# Patient Record
Sex: Female | Born: 1990
Health system: Southern US, Community
[De-identification: ages and names within clinical notes are randomized; demographics above are authoritative.]

## PROBLEM LIST (undated history)

## (undated) DIAGNOSIS — F419 Anxiety disorder, unspecified: Secondary | ICD-10-CM

## (undated) DIAGNOSIS — N390 Urinary tract infection, site not specified: Secondary | ICD-10-CM

## (undated) DIAGNOSIS — N750 Cyst of Bartholin's gland: Secondary | ICD-10-CM

## (undated) DIAGNOSIS — T8859XA Other complications of anesthesia, initial encounter: Secondary | ICD-10-CM

## (undated) DIAGNOSIS — N809 Endometriosis, unspecified: Secondary | ICD-10-CM

## (undated) DIAGNOSIS — R5600 Simple febrile convulsions: Secondary | ICD-10-CM

## (undated) DIAGNOSIS — T4145XA Adverse effect of unspecified anesthetic, initial encounter: Secondary | ICD-10-CM

## (undated) HISTORY — DX: Cyst of Bartholin's gland: N75.0

## (undated) HISTORY — PX: SHOULDER SURGERY: SHX246

## (undated) HISTORY — DX: Anxiety disorder, unspecified: F41.9

## (undated) HISTORY — PX: RETAINED PLACENTA REMOVAL: SHX2336

## (undated) HISTORY — DX: Endometriosis, unspecified: N80.9

## (undated) HISTORY — DX: Other complications of anesthesia, initial encounter: T88.59XA

## (undated) HISTORY — PX: TONSILLECTOMY: SUR1361

---
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
## 2014-08-09 ENCOUNTER — Ambulatory Visit: Payer: Self-pay | Admitting: Physician Assistant

## 2014-08-09 LAB — URINALYSIS, COMPLETE
BILIRUBIN, UR: NEGATIVE
Glucose,UR: NEGATIVE
KETONE: NEGATIVE
Nitrite: NEGATIVE
Ph: 6.5 (ref 5.0–8.0)
Protein: NEGATIVE
Specific Gravity: 1.005 (ref 1.000–1.030)
WBC UR: >30 /HPF (ref 0–5)

## 2014-08-11 LAB — URINE CULTURE

## 2015-04-04 ENCOUNTER — Ambulatory Visit
Admission: EM | Admit: 2015-04-04 | Discharge: 2015-04-04 | Disposition: A | Discharge status: Home / Self Care | Payer: 59 | Attending: Family Medicine | Admitting: Family Medicine

## 2015-04-04 DIAGNOSIS — L255 Unspecified contact dermatitis due to plants, except food: Secondary | ICD-10-CM

## 2015-04-04 HISTORY — DX: Simple febrile convulsions: R56.00

## 2015-04-04 MED ORDER — PREDNISONE 20 MG PO TABS: ORAL_TABLET | ORAL | Status: DC

## 2015-04-04 MED ORDER — TRIAMCINOLONE ACETONIDE 0.025 % EX OINT: 1.0000 "application " | TOPICAL_OINTMENT | Freq: Two times a day (BID) | CUTANEOUS | Status: DC

## 2015-04-04 NOTE — ED Provider Notes (Signed)
CSN: [id number]     Arrival date & time 04/04/15  0919 History   First MD Initiated Contact with Patient 04/04/15 1013     Chief Complaint  Patient presents with  . Rash   (Consider location/radiation/quality/duration/timing/severity/associated sxs/prior Treatment) HPI Comments: 24 yo female with a 4 days h/o red, itchy, blistery rash on hands, wrists since likely exposure to poison ivy or poison oak. Denies any pus drainage, fevers, chills, pain.   The history is provided by the patient.    Past Medical History  Diagnosis Date  . Febrile seizure   . UTI (lower urinary tract infection)    Past Surgical History  Procedure Laterality Date  . Tonsillectomy    . Shoulder surgery     Family History  Problem Relation Age of Onset  . Cancer Mother    Social History  Substance Use Topics  . Smoking status: Never Smoker   . Smokeless tobacco: None  . Alcohol Use: No   OB History    No data available     Review of Systems  Allergies  Review of patient's allergies indicates no known allergies.  Home Medications   Prior to Admission medications   Medication Sig Start Date End Date Taking? Authorizing Provider  levonorgestrel (MIRENA) 20 MCG/24HR IUD 1 each by Intrauterine route once.    Historical Provider, MD  nitrofurantoin, macrocrystal-monohydrate, (MACROBID) 100 MG capsule Take 1 capsule (100 mg total) by mouth 2 (two) times daily. 04/23/15   Renford Dills, NP  phenazopyridine (PYRIDIUM) 100 MG tablet Take 2 tablets (200 mg total) by mouth 3 (three) times daily as needed for pain. 04/23/15   Renford Dills, NP  predniSONE (DELTASONE) 20 MG tablet Take 3 tabs po qd for 2 days, then 2 tabs po qd for 3 days, then 1 tab po qd for 3 days, then half tab po qd for 2 days 04/04/15   Payton Mccallum, MD  triamcinolone (KENALOG) 0.025 % ointment Apply 1 application topically 2 (two) times daily. 04/04/15   Payton Mccallum, MD   Meds Ordered and Administered this Visit  Medications - No data  to display  BP 121/64 mmHg  Pulse 82  Temp(Src) 98.5 F (36.9 C) (Tympanic)  Ht 5\' 4"  (1.626 m)  Wt 156 lb (70.761 kg)  BMI 26.76 kg/m2  SpO2 100%  LMP  (Approximate) No data found.   Physical Exam  Constitutional: She appears well-developed and well-nourished. No distress.  Skin: Rash noted. She is not diaphoretic.  Vesicular, erythematous diffuse rash on hand and forearms; no purulent drainage  Nursing note and vitals reviewed.   ED Course  Procedures (including critical care time)  Labs Review Labs Reviewed - No data to display  Imaging Review No results found.   Visual Acuity Review  Right Eye Distance:   Left Eye Distance:   Bilateral Distance:    Right Eye Near:   Left Eye Near:    Bilateral Near:         MDM   1. Contact dermatitis due to plant    Discharge Medication List as of 04/04/2015 10:26 AM    START taking these medications   Details  predniSONE (DELTASONE) 20 MG tablet Take 3 tabs po qd for 2 days, then 2 tabs po qd for 3 days, then 1 tab po qd for 3 days, then half tab po qd for 2 days, Normal    triamcinolone (KENALOG) 0.025 % ointment Apply 1 application topically 2 (two) times daily., Starting 04/04/2015,  Until Discontinued, Normal      Plan: 1. diagnosis reviewed with patient 2. rx as per orders; risks, benefits, potential side effects reviewed with patient 3. Recommend supportive treatment with otc antihistamines prn 4. F/u prn if symptoms worsen or don't improve    Payton Mccallum, MD 04/29/15 [phone]

## 2015-04-04 NOTE — ED Notes (Signed)
Pt states "rash on hands started Tuesday, now spreading to both hands."

## 2015-04-23 ENCOUNTER — Ambulatory Visit
Admission: EM | Admit: 2015-04-23 | Discharge: 2015-04-23 | Disposition: A | Discharge status: Home / Self Care | Payer: 59 | Attending: Family Medicine | Admitting: Family Medicine

## 2015-04-23 DIAGNOSIS — N390 Urinary tract infection, site not specified: Secondary | ICD-10-CM

## 2015-04-23 HISTORY — DX: Urinary tract infection, site not specified: N39.0

## 2015-04-23 LAB — URINALYSIS COMPLETE WITH MICROSCOPIC (ARMC ONLY)
Bilirubin Urine: NEGATIVE
GLUCOSE, UA: NEGATIVE mg/dL
Ketones, ur: NEGATIVE mg/dL
Nitrite: NEGATIVE
Protein, ur: NEGATIVE mg/dL
Specific Gravity, Urine: 1.01 (ref 1.005–1.030)
pH: 5.5 (ref 5.0–8.0)

## 2015-04-23 LAB — PREGNANCY, URINE: PREG TEST UR: NEGATIVE

## 2015-04-23 MED ORDER — PHENAZOPYRIDINE HCL 100 MG PO TABS: 200.0000 mg | ORAL_TABLET | Freq: Three times a day (TID) | ORAL | Status: DC | PRN

## 2015-04-23 MED ORDER — NITROFURANTOIN MONOHYD MACRO 100 MG PO CAPS: 100.0000 mg | ORAL_CAPSULE | Freq: Two times a day (BID) | ORAL | Status: DC

## 2015-04-23 NOTE — ED Notes (Signed)
Hx of frequent UTI's. Woke this morning with burning with voiding, urinary frequency and suprapubic pain

## 2015-04-23 NOTE — Discharge Instructions (Signed)
Take medication as prescribed. Rest. Drink plenty of fluids.   Follow up with your primary care physician as needed. Return to Urgent care for new or worsening concerns.   Urinary Tract Infection Urinary tract infections (UTIs) can develop anywhere along your urinary tract. Your urinary tract is your body's drainage system for removing wastes and extra water. Your urinary tract includes two kidneys, two ureters, a bladder, and a urethra. Your kidneys are a pair of bean-shaped organs. Each kidney is about the size of your fist. They are located below your ribs, one on each side of your spine. CAUSES Infections are caused by microbes, which are microscopic organisms, including fungi, viruses, and bacteria. These organisms are so small that they can only be seen through a microscope. Bacteria are the microbes that most commonly cause UTIs. SYMPTOMS  Symptoms of UTIs may vary by age and gender of the patient and by the location of the infection. Symptoms in young women typically include a frequent and intense urge to urinate and a painful, burning feeling in the bladder or urethra during urination. Older women and men are more likely to be tired, shaky, and weak and have muscle aches and abdominal pain. A fever may mean the infection is in your kidneys. Other symptoms of a kidney infection include pain in your back or sides below the ribs, nausea, and vomiting. DIAGNOSIS To diagnose a UTI, your caregiver will ask you about your symptoms. Your caregiver also will ask to provide a urine sample. The urine sample will be tested for bacteria and white blood cells. White blood cells are made by your body to help fight infection. TREATMENT  Typically, UTIs can be treated with medication. Because most UTIs are caused by a bacterial infection, they usually can be treated with the use of antibiotics. The choice of antibiotic and length of treatment depend on your symptoms and the type of bacteria causing your  infection. HOME CARE INSTRUCTIONS  If you were prescribed antibiotics, take them exactly as your caregiver instructs you. Finish the medication even if you feel better after you have only taken some of the medication.  Drink enough water and fluids to keep your urine clear or pale yellow.  Avoid caffeine, tea, and carbonated beverages. They tend to irritate your bladder.  Empty your bladder often. Avoid holding urine for long periods of time.  Empty your bladder before and after sexual intercourse.  After a bowel movement, women should cleanse from front to back. Use each tissue only once. SEEK MEDICAL CARE IF:   You have back pain.  You develop a fever.  Your symptoms do not begin to resolve within 3 days. SEEK IMMEDIATE MEDICAL CARE IF:   You have severe back pain or lower abdominal pain.  You develop chills.  You have nausea or vomiting.  You have continued burning or discomfort with urination. MAKE SURE YOU:   Understand these instructions.  Will watch your condition.  Will get help right away if you are not doing well or get worse. Document Released: 04/28/2005 Document Revised: 01/18/2012 Document Reviewed: 08/27/2011 [facility] Patient Information 2015 [place], [place]. This information is not intended to replace advice given to you by your health care provider. Make sure you discuss any questions you have with your health care provider.

## 2015-04-23 NOTE — ED Provider Notes (Signed)
[facility] Emergency Department Provider Note  ____________________________________________  Time seen: Approximately 7:42 AM  I have reviewed the triage vital signs and the nursing notes.   HISTORY  Chief Complaint Cystitis   HPI Brittany Carter is a 24 y.o. female presents for urinary urgency and burning with urination since this am. Reports some intermittent low back pain. Denies abdominal pain, fever, nausea, vomiting or other complaints. Denies vaginal pain, vaginal discharge or odor. Reports history of UTIs and this feels similar. States last UTI in march.    Past Medical History  Diagnosis Date  . Febrile seizure   . UTI (lower urinary tract infection)    Reports history of MRI/CTs evaluating renal without abnormality.   There are no active problems to display for this patient.   Past Surgical History  Procedure Laterality Date  . Tonsillectomy    . Shoulder surgery      Current Outpatient Rx  Name  Route  Sig  Dispense  Refill  . levonorgestrel (MIRENA) 20 MCG/24HR IUD   Intrauterine   1 each by Intrauterine route once.         .           .             Allergies Review of patient's allergies indicates no known allergies.  Family History  Problem Relation Age of Onset  . Cancer Mother     Social History Social History  Substance Use Topics  . Smoking status: Never Smoker   . Smokeless tobacco: None  . Alcohol Use: No    Review of Systems Constitutional: No fever/chills Eyes: No visual changes. ENT: No sore throat. Cardiovascular: Denies chest pain. Respiratory: Denies shortness of breath. Gastrointestinal: No abdominal pain.  No nausea, no vomiting.  No diarrhea.  No constipation. Genitourinary: positive for dysuria. Musculoskeletal: Negative for back pain. Skin: Negative for rash. Neurological: Negative for headaches, focal weakness or numbness.  10-point ROS otherwise  negative.  ____________________________________________   PHYSICAL EXAM:  VITAL SIGNS: ED Triage Vitals  Enc Vitals Group     BP 04/23/15 0717 123/67 mmHg     Pulse Rate 04/23/15 0717 87     Resp 04/23/15 0717 16     Temp 04/23/15 0717 97.9 F (36.6 C)     Temp Source 04/23/15 0717 Tympanic     SpO2 04/23/15 0717 100 %     Weight 04/23/15 0717 157 lb (71.215 kg)     Height 04/23/15 0717  (1.626 m)     Head Cir --      Peak Flow --      Pain Score 04/23/15 0720 6     Pain Loc --      Pain Edu? --      Excl. in GC? --     Constitutional: Alert and oriented. Well appearing and in no acute distress. Eyes: Conjunctivae are normal. PERRL. EOMI. Head: Atraumatic.  Nose: No congestion/rhinnorhea.  Mouth/Throat: Mucous membranes are moist.  Oropharynx non-erythematous. Neck: No stridor.  No cervical spine tenderness to palpation. Hematological/Lymphatic/Immunilogical: No cervical lymphadenopathy. Cardiovascular: Normal rate, regular rhythm. Grossly normal heart sounds.  Good peripheral circulation. Respiratory: Normal respiratory effort.  No retractions. Lungs CTAB. Gastrointestinal: Soft and nontender. No distention. Normal Bowel sounds. . No CVA tenderness. Musculoskeletal: No lower or upper extremity tenderness nor edema.  No joint effusions. Bilateral pedal pulses equal and easily palpated.  Neurologic:  Normal speech and language. No gross focal neurologic deficits  are appreciated. No gait instability. Skin:  Skin is warm, dry and intact. No rash noted. Psychiatric: Mood and affect are normal. Speech and behavior are normal.  ____________________________________________   LABS (all labs ordered are listed, but only abnormal results are displayed)  Labs Reviewed  URINALYSIS COMPLETEWITH MICROSCOPIC ([facility] ONLY) - Abnormal; Notable for the following:    APPearance CLOUDY (*)    Hgb urine dipstick 2+ (*)    Leukocytes, UA 2+ (*)    Bacteria, UA FEW (*)    Squamous  Epithelial / LPF 0-5 (*)    All other components within normal limits  URINE CULTURE  PREGNANCY, URINE    INITIAL IMPRESSION / ASSESSMENT AND PLAN / ED COURSE  Pertinent labs & imaging results that were available during my care of the patient were reviewed by me and considered in my medical decision making (see chart for details).  Will treat UTI with macrobid and pyridium. Discussed supportive treatments including rest and fluids and void post intercourse. Discussed follow up with Primary care physician this week. Discussed follow up and return parameters including no resolution or any worsening concerns. Patient verbalized understanding and agreed to plan.   ____________________________________________   FINAL CLINICAL IMPRESSION(S) / ED DIAGNOSES  Final diagnoses:  UTI (lower urinary tract infection)    \   Renford Dills, NP 04/23/15 [phone]

## 2015-04-25 LAB — URINE CULTURE: Culture: 80000

## 2015-12-26 ENCOUNTER — Encounter: Payer: Self-pay | Admitting: Obstetrics & Gynecology

## 2015-12-26 ENCOUNTER — Ambulatory Visit (INDEPENDENT_AMBULATORY_CARE_PROVIDER_SITE_OTHER): Payer: 59 | Admitting: Obstetrics & Gynecology

## 2015-12-26 VITALS — BP 117/76 | HR 108 | Ht 63.0 in | Wt 152.0 lb

## 2015-12-26 DIAGNOSIS — Z124 Encounter for screening for malignant neoplasm of cervix: Secondary | ICD-10-CM

## 2015-12-26 DIAGNOSIS — Z30431 Encounter for routine checking of intrauterine contraceptive device: Secondary | ICD-10-CM

## 2015-12-26 DIAGNOSIS — Z113 Encounter for screening for infections with a predominantly sexual mode of transmission: Secondary | ICD-10-CM | POA: Diagnosis not present

## 2015-12-26 DIAGNOSIS — Z975 Presence of (intrauterine) contraceptive device: Secondary | ICD-10-CM

## 2015-12-26 DIAGNOSIS — Z01419 Encounter for gynecological examination (general) (routine) without abnormal findings: Secondary | ICD-10-CM

## 2015-12-26 NOTE — Patient Instructions (Signed)
Preventive Care for Adults, Female A healthy lifestyle and preventive care can promote health and wellness. Preventive health guidelines for women include the following key practices.  A routine yearly physical is a good way to check with your health care provider about your health and preventive screening. It is a chance to share any concerns and updates on your health and to receive a thorough exam.  Visit your dentist for a routine exam and preventive care every 6 months. Brush your teeth twice a day and floss once a day. Good oral hygiene prevents tooth decay and gum disease.  The frequency of eye exams is based on your age, health, family medical history, use of contact lenses, and other factors. Follow your health care provider's recommendations for frequency of eye exams.  Eat a healthy diet. Foods like vegetables, fruits, whole grains, low-fat dairy products, and lean protein foods contain the nutrients you need without too many calories. Decrease your intake of foods high in solid fats, added sugars, and salt. Eat the right amount of calories for you.Get information about a proper diet from your health care provider, if necessary.  Regular physical exercise is one of the most important things you can do for your health. Most adults should get at least 150 minutes of moderate-intensity exercise (any activity that increases your heart rate and causes you to sweat) each week. In addition, most adults need muscle-strengthening exercises on 2 or more days a week.  Maintain a healthy weight. The body mass index (BMI) is a screening tool to identify possible weight problems. It provides an estimate of body fat based on height and weight. Your health care provider can find your BMI and can help you achieve or maintain a healthy weight.For adults 20 years and older:  A BMI below 18.5 is considered underweight.  A BMI of 18.5 to 24.9 is normal.  A BMI of 25 to 29.9 is considered overweight.  A  BMI of 30 and above is considered obese.  Maintain normal blood lipids and cholesterol levels by exercising and minimizing your intake of saturated fat. Eat a balanced diet with plenty of fruit and vegetables. Blood tests for lipids and cholesterol should begin at age 45 and be repeated every 5 years. If your lipid or cholesterol levels are high, you are over 50, or you are at high risk for heart disease, you may need your cholesterol levels checked more frequently.Ongoing high lipid and cholesterol levels should be treated with medicines if diet and exercise are not working.  If you smoke, find out from your health care provider how to quit. If you do not use tobacco, do not start.  Lung cancer screening is recommended for adults aged 45-80 years who are at high risk for developing lung cancer because of a history of smoking. A yearly low-dose CT scan of the lungs is recommended for people who have at least a 30-pack-year history of smoking and are a current smoker or have quit within the past 15 years. A pack year of smoking is smoking an average of 1 pack of cigarettes a day for 1 year (for example: 1 pack a day for 30 years or 2 packs a day for 15 years). Yearly screening should continue until the smoker has stopped smoking for at least 15 years. Yearly screening should be stopped for people who develop a health problem that would prevent them from having lung cancer treatment.  If you are pregnant, do not drink alcohol. If you are  breastfeeding, be very cautious about drinking alcohol. If you are not pregnant and choose to drink alcohol, do not have more than 1 drink per day. One drink is considered to be 12 ounces (355 mL) of beer, 5 ounces (148 mL) of wine, or 1.5 ounces (44 mL) of liquor.  Avoid use of street drugs. Do not share needles with anyone. Ask for help if you need support or instructions about stopping the use of drugs.  High blood pressure causes heart disease and increases the risk  of stroke. Your blood pressure should be checked at least every 1 to 2 years. Ongoing high blood pressure should be treated with medicines if weight loss and exercise do not work.  If you are 55-79 years old, ask your health care provider if you should take aspirin to prevent strokes.  Diabetes screening is done by taking a blood sample to check your blood glucose level after you have not eaten for a certain period of time (fasting). If you are not overweight and you do not have risk factors for diabetes, you should be screened once every 3 years starting at age 45. If you are overweight or obese and you are 40-70 years of age, you should be screened for diabetes every year as part of your cardiovascular risk assessment.  Breast cancer screening is essential preventive care for women. You should practice "breast self-awareness." This means understanding the normal appearance and feel of your breasts and may include breast self-examination. Any changes detected, no matter how small, should be reported to a health care provider. Women in their 20s and 30s should have a clinical breast exam (CBE) by a health care provider as part of a regular health exam every 1 to 3 years. After age 40, women should have a CBE every year. Starting at age 40, women should consider having a mammogram (breast X-ray test) every year. Women who have a family history of breast cancer should talk to their health care provider about genetic screening. Women at a high risk of breast cancer should talk to their health care providers about having an MRI and a mammogram every year.  Breast cancer gene (BRCA)-related cancer risk assessment is recommended for women who have family members with BRCA-related cancers. BRCA-related cancers include breast, ovarian, tubal, and peritoneal cancers. Having family members with these cancers may be associated with an increased risk for harmful changes (mutations) in the breast cancer genes BRCA1 and  BRCA2. Results of the assessment will determine the need for genetic counseling and BRCA1 and BRCA2 testing.  Your health care provider may recommend that you be screened regularly for cancer of the pelvic organs (ovaries, uterus, and vagina). This screening involves a pelvic examination, including checking for microscopic changes to the surface of your cervix (Pap test). You may be encouraged to have this screening done every 3 years, beginning at age 21.  For women ages 30-65, health care providers may recommend pelvic exams and Pap testing every 3 years, or they may recommend the Pap and pelvic exam, combined with testing for human papilloma virus (HPV), every 5 years. Some types of HPV increase your risk of cervical cancer. Testing for HPV may also be done on women of any age with unclear Pap test results.  Other health care providers may not recommend any screening for nonpregnant women who are considered low risk for pelvic cancer and who do not have symptoms. Ask your health care provider if a screening pelvic exam is right for   you.  If you have had past treatment for cervical cancer or a condition that could lead to cancer, you need Pap tests and screening for cancer for at least 20 years after your treatment. If Pap tests have been discontinued, your risk factors (such as having a new sexual partner) need to be reassessed to determine if screening should resume. Some women have medical problems that increase the chance of getting cervical cancer. In these cases, your health care provider may recommend more frequent screening and Pap tests.  Colorectal cancer can be detected and often prevented. Most routine colorectal cancer screening begins at the age of 50 years and continues through age 75 years. However, your health care provider may recommend screening at an earlier age if you have risk factors for colon cancer. On a yearly basis, your health care provider may provide home test kits to check  for hidden blood in the stool. Use of a small camera at the end of a tube, to directly examine the colon (sigmoidoscopy or colonoscopy), can detect the earliest forms of colorectal cancer. Talk to your health care provider about this at age 50, when routine screening begins. Direct exam of the colon should be repeated every 5-10 years through age 75 years, unless early forms of precancerous polyps or small growths are found.  People who are at an increased risk for hepatitis B should be screened for this virus. You are considered at high risk for hepatitis B if:  You were born in a country where hepatitis B occurs often. Talk with your health care provider about which countries are considered high risk.  Your parents were born in a high-risk country and you have not received a shot to protect against hepatitis B (hepatitis B vaccine).  You have HIV or AIDS.  You use needles to inject street drugs.  You live with, or have sex with, someone who has hepatitis B.  You get hemodialysis treatment.  You take certain medicines for conditions like cancer, organ transplantation, and autoimmune conditions.  Hepatitis C blood testing is recommended for all people born from 1945 through 1965 and any individual with known risks for hepatitis C.  Practice safe sex. Use condoms and avoid high-risk sexual practices to reduce the spread of sexually transmitted infections (STIs). STIs include gonorrhea, chlamydia, syphilis, trichomonas, herpes, HPV, and human immunodeficiency virus (HIV). Herpes, HIV, and HPV are viral illnesses that have no cure. They can result in disability, cancer, and death.  You should be screened for sexually transmitted illnesses (STIs) including gonorrhea and chlamydia if:  You are sexually active and are younger than 24 years.  You are older than 24 years and your health care provider tells you that you are at risk for this type of infection.  Your sexual activity has changed  since you were last screened and you are at an increased risk for chlamydia or gonorrhea. Ask your health care provider if you are at risk.  If you are at risk of being infected with HIV, it is recommended that you take a prescription medicine daily to prevent HIV infection. This is called preexposure prophylaxis (PrEP). You are considered at risk if:  You are sexually active and do not regularly use condoms or know the HIV status of your partner(s).  You take drugs by injection.  You are sexually active with a partner who has HIV.  Talk with your health care provider about whether you are at high risk of being infected with HIV. If   you choose to begin PrEP, you should first be tested for HIV. You should then be tested every 3 months for as long as you are taking PrEP.  Osteoporosis is a disease in which the bones lose minerals and strength with aging. This can result in serious bone fractures or breaks. The risk of osteoporosis can be identified using a bone density scan. Women ages 67 years and over and women at risk for fractures or osteoporosis should discuss screening with their health care providers. Ask your health care provider whether you should take a calcium supplement or vitamin D to reduce the rate of osteoporosis.  Menopause can be associated with physical symptoms and risks. Hormone replacement therapy is available to decrease symptoms and risks. You should talk to your health care provider about whether hormone replacement therapy is right for you.  Use sunscreen. Apply sunscreen liberally and repeatedly throughout the day. You should seek shade when your shadow is shorter than you. Protect yourself by wearing long sleeves, pants, a wide-brimmed hat, and sunglasses year round, whenever you are outdoors.  Once a month, do a whole body skin exam, using a mirror to look at the skin on your back. Tell your health care provider of new moles, moles that have irregular borders, moles that  are larger than a pencil eraser, or moles that have changed in shape or color.  Stay current with required vaccines (immunizations).  Influenza vaccine. All adults should be immunized every year.  Tetanus, diphtheria, and acellular pertussis (Td, Tdap) vaccine. Pregnant women should receive 1 dose of Tdap vaccine during each pregnancy. The dose should be obtained regardless of the length of time since the last dose. Immunization is preferred during the 27th-36th week of gestation. An adult who has not previously received Tdap or who does not know her vaccine status should receive 1 dose of Tdap. This initial dose should be followed by tetanus and diphtheria toxoids (Td) booster doses every 10 years. Adults with an unknown or incomplete history of completing a 3-dose immunization series with Td-containing vaccines should begin or complete a primary immunization series including a Tdap dose. Adults should receive a Td booster every 10 years.  Varicella vaccine. An adult without evidence of immunity to varicella should receive 2 doses or a second dose if she has previously received 1 dose. Pregnant females who do not have evidence of immunity should receive the first dose after pregnancy. This first dose should be obtained before leaving the health care facility. The second dose should be obtained 4-8 weeks after the first dose.  Human papillomavirus (HPV) vaccine. Females aged 13-26 years who have not received the vaccine previously should obtain the 3-dose series. The vaccine is not recommended for use in pregnant females. However, pregnancy testing is not needed before receiving a dose. If a female is found to be pregnant after receiving a dose, no treatment is needed. In that case, the remaining doses should be delayed until after the pregnancy. Immunization is recommended for any person with an immunocompromised condition through the age of 61 years if she did not get any or all doses earlier. During the  3-dose series, the second dose should be obtained 4-8 weeks after the first dose. The third dose should be obtained 24 weeks after the first dose and 16 weeks after the second dose.  Zoster vaccine. One dose is recommended for adults aged 30 years or older unless certain conditions are present.  Measles, mumps, and rubella (MMR) vaccine. Adults born  before 1957 generally are considered immune to measles and mumps. Adults born in 1957 or later should have 1 or more doses of MMR vaccine unless there is a contraindication to the vaccine or there is laboratory evidence of immunity to each of the three diseases. A routine second dose of MMR vaccine should be obtained at least 28 days after the first dose for students attending postsecondary schools, health care workers, or international travelers. People who received inactivated measles vaccine or an unknown type of measles vaccine during 1963-1967 should receive 2 doses of MMR vaccine. People who received inactivated mumps vaccine or an unknown type of mumps vaccine before 1979 and are at high risk for mumps infection should consider immunization with 2 doses of MMR vaccine. For females of childbearing age, rubella immunity should be determined. If there is no evidence of immunity, females who are not pregnant should be vaccinated. If there is no evidence of immunity, females who are pregnant should delay immunization until after pregnancy. Unvaccinated health care workers born before 1957 who lack laboratory evidence of measles, mumps, or rubella immunity or laboratory confirmation of disease should consider measles and mumps immunization with 2 doses of MMR vaccine or rubella immunization with 1 dose of MMR vaccine.  Pneumococcal 13-valent conjugate (PCV13) vaccine. When indicated, a person who is uncertain of his immunization history and has no record of immunization should receive the PCV13 vaccine. All adults 65 years of age and older should receive this  vaccine. An adult aged 19 years or older who has certain medical conditions and has not been previously immunized should receive 1 dose of PCV13 vaccine. This PCV13 should be followed with a dose of pneumococcal polysaccharide (PPSV23) vaccine. Adults who are at high risk for pneumococcal disease should obtain the PPSV23 vaccine at least 8 weeks after the dose of PCV13 vaccine. Adults older than 23 years of age who have normal immune system function should obtain the PPSV23 vaccine dose at least 1 year after the dose of PCV13 vaccine.  Pneumococcal polysaccharide (PPSV23) vaccine. When PCV13 is also indicated, PCV13 should be obtained first. All adults aged 65 years and older should be immunized. An adult younger than age 65 years who has certain medical conditions should be immunized. Any person who resides in a nursing home or long-term care facility should be immunized. An adult smoker should be immunized. People with an immunocompromised condition and certain other conditions should receive both PCV13 and PPSV23 vaccines. People with human immunodeficiency virus (HIV) infection should be immunized as soon as possible after diagnosis. Immunization during chemotherapy or radiation therapy should be avoided. Routine use of PPSV23 vaccine is not recommended for American Indians, [place] Natives, or people younger than 65 years unless there are medical conditions that require PPSV23 vaccine. When indicated, people who have unknown immunization and have no record of immunization should receive PPSV23 vaccine. One-time revaccination 5 years after the first dose of PPSV23 is recommended for people aged 19-64 years who have chronic kidney failure, nephrotic syndrome, asplenia, or immunocompromised conditions. People who received 1-2 doses of PPSV23 before age 65 years should receive another dose of PPSV23 vaccine at age 65 years or later if at least 5 years have passed since the previous dose. Doses of PPSV23 are not  needed for people immunized with PPSV23 at or after age 65 years.  Meningococcal vaccine. Adults with asplenia or persistent complement component deficiencies should receive 2 doses of quadrivalent meningococcal conjugate (MenACWY-D) vaccine. The doses should be obtained   at least 2 months apart. Microbiologists working with certain meningococcal bacteria, [place] recruits, people at risk during an outbreak, and people who travel to or live in countries with a high rate of meningitis should be immunized. A first-year college student up through age 34 years who is living in a residence hall should receive a dose if she did not receive a dose on or after her 16th birthday. Adults who have certain high-risk conditions should receive one or more doses of vaccine.  Hepatitis A vaccine. Adults who wish to be protected from this disease, have certain high-risk conditions, work with hepatitis A-infected animals, work in hepatitis A research labs, or travel to or work in countries with a high rate of hepatitis A should be immunized. Adults who were previously unvaccinated and who anticipate close contact with an international adoptee during the first 60 days after arrival in the [place] from a country with a high rate of hepatitis A should be immunized.  Hepatitis B vaccine. Adults who wish to be protected from this disease, have certain high-risk conditions, may be exposed to blood or other infectious body fluids, are household contacts or sex partners of hepatitis B positive people, are clients or workers in certain care facilities, or travel to or work in countries with a high rate of hepatitis B should be immunized.  Haemophilus influenzae type b (Hib) vaccine. A previously unvaccinated person with asplenia or sickle cell disease or having a scheduled splenectomy should receive 1 dose of Hib vaccine. Regardless of previous immunization, a recipient of a hematopoietic stem cell transplant should receive a  3-dose series 6-12 months after her successful transplant. Hib vaccine is not recommended for adults with HIV infection. Preventive Services / Frequency Ages 35 to 4 years  Blood pressure check.** / Every 3-5 years.  Lipid and cholesterol check.** / Every 5 years beginning at age 60.  Clinical breast exam.** / Every 3 years for women in their 71s and 10s.  BRCA-related cancer risk assessment.** / For women who have family members with a BRCA-related cancer (breast, ovarian, tubal, or peritoneal cancers).  Pap test.** / Every 2 years from ages 76 through 26. Every 3 years starting at age 61 through age 76 or 93 with a history of 3 consecutive normal Pap tests.  HPV screening.** / Every 3 years from ages 37 through ages 60 to 51 with a history of 3 consecutive normal Pap tests.  Hepatitis C blood test.** / For any individual with known risks for hepatitis C.  Skin self-exam. / Monthly.  Influenza vaccine. / Every year.  Tetanus, diphtheria, and acellular pertussis (Tdap, Td) vaccine.** / Consult your health care provider. Pregnant women should receive 1 dose of Tdap vaccine during each pregnancy. 1 dose of Td every 10 years.  Varicella vaccine.** / Consult your health care provider. Pregnant females who do not have evidence of immunity should receive the first dose after pregnancy.  HPV vaccine. / 3 doses over 6 months, if 93 and younger. The vaccine is not recommended for use in pregnant females. However, pregnancy testing is not needed before receiving a dose.  Measles, mumps, rubella (MMR) vaccine.** / You need at least 1 dose of MMR if you were born in 1957 or later. You may also need a 2nd dose. For females of childbearing age, rubella immunity should be determined. If there is no evidence of immunity, females who are not pregnant should be vaccinated. If there is no evidence of immunity, females who are  pregnant should delay immunization until after pregnancy.  Pneumococcal  13-valent conjugate (PCV13) vaccine.** / Consult your health care provider.  Pneumococcal polysaccharide (PPSV23) vaccine.** / 1 to 2 doses if you smoke cigarettes or if you have certain conditions.  Meningococcal vaccine.** / 1 dose if you are age 68 to 8 years and a Market researcher living in a residence hall, or have one of several medical conditions, you need to get vaccinated against meningococcal disease. You may also need additional booster doses.  Hepatitis A vaccine.** / Consult your health care provider.  Hepatitis B vaccine.** / Consult your health care provider.  Haemophilus influenzae type b (Hib) vaccine.** / Consult your health care provider. Ages 7 to 53 years  Blood pressure check.** / Every year.  Lipid and cholesterol check.** / Every 5 years beginning at age 25 years.  Lung cancer screening. / Every year if you are aged 11-80 years and have a 30-pack-year history of smoking and currently smoke or have quit within the past 15 years. Yearly screening is stopped once you have quit smoking for at least 15 years or develop a health problem that would prevent you from having lung cancer treatment.  Clinical breast exam.** / Every year after age 48 years.  BRCA-related cancer risk assessment.** / For women who have family members with a BRCA-related cancer (breast, ovarian, tubal, or peritoneal cancers).  Mammogram.** / Every year beginning at age 41 years and continuing for as long as you are in good health. Consult with your health care provider.  Pap test.** / Every 3 years starting at age 65 years through age 37 or 70 years with a history of 3 consecutive normal Pap tests.  HPV screening.** / Every 3 years from ages 72 years through ages 60 to 40 years with a history of 3 consecutive normal Pap tests.  Fecal occult blood test (FOBT) of stool. / Every year beginning at age 21 years and continuing until age 5 years. You may not need to do this test if you get  a colonoscopy every 10 years.  Flexible sigmoidoscopy or colonoscopy.** / Every 5 years for a flexible sigmoidoscopy or every 10 years for a colonoscopy beginning at age 35 years and continuing until age 48 years.  Hepatitis C blood test.** / For all people born from [birth year] through 1965 and any individual with known risks for hepatitis C.  Skin self-exam. / Monthly.  Influenza vaccine. / Every year.  Tetanus, diphtheria, and acellular pertussis (Tdap/Td) vaccine.** / Consult your health care provider. Pregnant women should receive 1 dose of Tdap vaccine during each pregnancy. 1 dose of Td every 10 years.  Varicella vaccine.** / Consult your health care provider. Pregnant females who do not have evidence of immunity should receive the first dose after pregnancy.  Zoster vaccine.** / 1 dose for adults aged 30 years or older.  Measles, mumps, rubella (MMR) vaccine.** / You need at least 1 dose of MMR if you were born in 1957 or later. You may also need a second dose. For females of childbearing age, rubella immunity should be determined. If there is no evidence of immunity, females who are not pregnant should be vaccinated. If there is no evidence of immunity, females who are pregnant should delay immunization until after pregnancy.  Pneumococcal 13-valent conjugate (PCV13) vaccine.** / Consult your health care provider.  Pneumococcal polysaccharide (PPSV23) vaccine.** / 1 to 2 doses if you smoke cigarettes or if you have certain conditions.  Meningococcal vaccine.** /  Consult your health care provider.  Hepatitis A vaccine.** / Consult your health care provider.  Hepatitis B vaccine.** / Consult your health care provider.  Haemophilus influenzae type b (Hib) vaccine.** / Consult your health care provider. Ages 64 years and over  Blood pressure check.** / Every year.  Lipid and cholesterol check.** / Every 5 years beginning at age 23 years.  Lung cancer screening. / Every year if you  are aged 16-80 years and have a 30-pack-year history of smoking and currently smoke or have quit within the past 15 years. Yearly screening is stopped once you have quit smoking for at least 15 years or develop a health problem that would prevent you from having lung cancer treatment.  Clinical breast exam.** / Every year after age 74 years.  BRCA-related cancer risk assessment.** / For women who have family members with a BRCA-related cancer (breast, ovarian, tubal, or peritoneal cancers).  Mammogram.** / Every year beginning at age 44 years and continuing for as long as you are in good health. Consult with your health care provider.  Pap test.** / Every 3 years starting at age 58 years through age 22 or 39 years with 3 consecutive normal Pap tests. Testing can be stopped between 65 and 70 years with 3 consecutive normal Pap tests and no abnormal Pap or HPV tests in the past 10 years.  HPV screening.** / Every 3 years from ages 64 years through ages 70 or 61 years with a history of 3 consecutive normal Pap tests. Testing can be stopped between 65 and 70 years with 3 consecutive normal Pap tests and no abnormal Pap or HPV tests in the past 10 years.  Fecal occult blood test (FOBT) of stool. / Every year beginning at age 40 years and continuing until age 27 years. You may not need to do this test if you get a colonoscopy every 10 years.  Flexible sigmoidoscopy or colonoscopy.** / Every 5 years for a flexible sigmoidoscopy or every 10 years for a colonoscopy beginning at age 7 years and continuing until age 32 years.  Hepatitis C blood test.** / For all people born from [birth year] through 1963 and any individual with known risks for hepatitis C.  Osteoporosis screening.** / A one-time screening for women ages 30 years and over and women at risk for fractures or osteoporosis.  Skin self-exam. / Monthly.  Influenza vaccine. / Every year.  Tetanus, diphtheria, and acellular pertussis (Tdap/Td)  vaccine.** / 1 dose of Td every 10 years.  Varicella vaccine.** / Consult your health care provider.  Zoster vaccine.** / 1 dose for adults aged 35 years or older.  Pneumococcal 13-valent conjugate (PCV13) vaccine.** / Consult your health care provider.  Pneumococcal polysaccharide (PPSV23) vaccine.** / 1 dose for all adults aged 46 years and older.  Meningococcal vaccine.** / Consult your health care provider.  Hepatitis A vaccine.** / Consult your health care provider.  Hepatitis B vaccine.** / Consult your health care provider.  Haemophilus influenzae type b (Hib) vaccine.** / Consult your health care provider. ** Family history and personal history of risk and conditions may change your health care provider's recommendations.   This information is not intended to replace advice given to you by your health care provider. Make sure you discuss any questions you have with your health care provider.   Document Released: 09/14/2001 Document Revised: 08/09/2014 Document Reviewed: 12/14/2010 Elsevier Interactive Patient Education [employer].

## 2015-12-26 NOTE — Progress Notes (Signed)
GYNECOLOGY CLINIC ANNUAL PREVENTATIVE CARE ENCOUNTER NOTE  Subjective:   Brittany Carter is a 25 y.o. G0 female here for a routine annual gynecologic exam.  Current complaints: none.   Denies abnormal vaginal bleeding, discharge, pelvic pain, problems with intercourse or other gynecologic concerns.    Gynecologic History Patient's last menstrual period was 09/28/2015. Contraception: Mirena IUD Last Pap: last year. Results were: normal   Obstetric History OB History  No data available    Past Medical History  Diagnosis Date  . Febrile seizure (HCC)   . UTI (lower urinary tract infection)   . Endometriosis     dx by symptoms, no surgery  . Bartholin cyst     Past Surgical History  Procedure Laterality Date  . Tonsillectomy    . Shoulder surgery      Current Outpatient Prescriptions on File Prior to Visit  Medication Sig Dispense Refill  . levonorgestrel (MIRENA) 20 MCG/24HR IUD 1 each by Intrauterine route once.     No current facility-administered medications on file prior to visit.    No Known Allergies  Social History   Social History  . Marital Status: Married    Spouse Name: N/A  . Number of Children: N/A  . Years of Education: N/A   Occupational History  . Not on file.   Social History Main Topics  . Smoking status: Never Smoker   . Smokeless tobacco: Not on file  . Alcohol Use: Yes     Comment: social  . Drug Use: No  . Sexual Activity: Yes    Birth Control/ Protection: IUD   Other Topics Concern  . Not on file   Social History Narrative    Family History  Problem Relation Age of Onset  . Cancer Mother   . Epilepsy Father   . Diabetes Maternal Grandmother   . Heart disease Maternal Grandmother   . Cancer Maternal Grandmother   . Diabetes Maternal Grandfather   . Heart disease Maternal Grandfather   . Diabetes Paternal Grandmother   . Heart disease Paternal Grandmother   . Diabetes Paternal Grandfather   . Heart disease  Paternal Grandfather   . Hyperlipidemia Paternal Grandfather     The following portions of the patient's history were reviewed and updated as appropriate: allergies, current medications, past family history, past medical history, past social history, past surgical history and problem list.  Review of Systems Pertinent items noted in HPI and remainder of comprehensive ROS otherwise negative.   Objective:  BP 117/76 mmHg  Pulse 108  Ht  (1.6 m)  Wt 152 lb (68.947 kg)  BMI 26.93 kg/m2  LMP 09/28/2015 CONSTITUTIONAL: Well-developed, well-nourished female in no acute distress.  HENT:  Normocephalic, atraumatic, External right and left ear normal. Oropharynx is clear and moist EYES: Conjunctivae and EOM are normal. Pupils are equal, round, and reactive to light. No scleral icterus.  NECK: Normal range of motion, supple, no masses.  Normal thyroid.  SKIN: Skin is warm and dry. No rash noted. Not diaphoretic. No erythema. No pallor. NEUROLOGIC: Alert and oriented to person, place, and time. Normal reflexes, muscle tone coordination. No cranial nerve deficit noted. PSYCHIATRIC: Normal mood and affect. Normal behavior. Normal judgment and thought content. CARDIOVASCULAR: Normal heart rate noted, regular rhythm RESPIRATORY: Clear to auscultation bilaterally. Effort and breath sounds normal, no problems with respiration noted. BREASTS: Symmetric in size. No masses, skin changes, nipple drainage, or lymphadenopathy. ABDOMEN: Soft, normal bowel sounds, no distention noted.  No  tenderness, rebound or guarding.  PELVIC: Normal appearing external genitalia; normal appearing vaginal mucosa and cervix.  IUD strings visualized. No abnormal discharge noted.  Pap smear obtained.  Normal uterine size, no other palpable masses, no uterine or adnexal tenderness. MUSCULOSKELETAL: Normal range of motion. No tenderness.  No cyanosis, clubbing, or edema.  2+ distal pulses.   Assessment:  Annual gynecologic  examination with pap smear   Plan:  Will follow up results of pap smear and manage accordingly. Routine preventative health maintenance measures emphasized. Please refer to After Visit Summary for other counseling recommendations.    Jaynie CollinsUGONNA  Bradford Cazier, MD, FACOG Attending Obstetrician & Gynecologist, [facility]

## 2015-12-30 LAB — CYTOLOGY - PAP

## 2016-01-09 ENCOUNTER — Encounter: Payer: Self-pay | Admitting: Internal Medicine

## 2016-01-09 ENCOUNTER — Ambulatory Visit (INDEPENDENT_AMBULATORY_CARE_PROVIDER_SITE_OTHER): Payer: 59 | Admitting: Internal Medicine

## 2016-01-09 VITALS — BP 92/60 | HR 79 | Temp 98.6°F | Ht 63.0 in | Wt 151.8 lb

## 2016-01-09 DIAGNOSIS — R5600 Simple febrile convulsions: Secondary | ICD-10-CM | POA: Insufficient documentation

## 2016-01-09 DIAGNOSIS — B360 Pityriasis versicolor: Secondary | ICD-10-CM | POA: Diagnosis not present

## 2016-01-09 DIAGNOSIS — F411 Generalized anxiety disorder: Secondary | ICD-10-CM

## 2016-01-09 DIAGNOSIS — N809 Endometriosis, unspecified: Secondary | ICD-10-CM

## 2016-01-09 MED ORDER — SELENIUM SULFIDE 2.25 % EX FOAM: 1.0000 "application " | Freq: Every day | CUTANEOUS | Status: DC

## 2016-01-09 NOTE — Assessment & Plan Note (Signed)
Controlled with IUD 

## 2016-01-09 NOTE — Patient Instructions (Signed)
Tinea Versicolor Tinea versicolor is a common fungal infection of the skin. It causes a rash that appears as light or dark patches on the skin. The rash most often occurs on the chest, back, neck, or upper arms. This condition is more common during warm weather. Other than affecting how your skin looks, tinea versicolor usually does not cause other problems. In most cases, the infection goes away in a few weeks with treatment. It may take a few months for the patches on your skin to clear up. CAUSES Tinea versicolor occurs when a type of fungus that is normally present on the skin starts to overgrow. This fungus is a kind of yeast. The exact cause of the overgrowth is not known. This condition cannot be passed from one person to another (noncontagious). RISK FACTORS This condition is more likely to develop when certain factors are present, such as:  Heat and humidity.  Sweating too much.  Hormone changes.  Oily skin.  A weak defense (immune) system. SYMPTOMS Symptoms of this condition may include:  A rash on your skin that is made up of light or dark patches. The rash may have:  Patches of tan or pink spots on light skin.  Patches of white or brown spots on dark skin.  Patches of skin that do not tan.  Well-marked edges.  Scales on the discolored areas.  Mild itching. DIAGNOSIS A health care provider can usually diagnose this condition by looking at your skin. During the exam, he or she may use ultraviolet light to help determine the extent of the infection. In some cases, a skin sample may be taken by scraping the rash. This sample will be viewed under a microscope to check for yeast overgrowth. TREATMENT Treatment for this condition may include:  Dandruff shampoo that is applied to the affected skin during showers or bathing.  Over-the-counter medicated skin cream, lotion, or soaps.  Prescription antifungal medicine in the form of skin cream or pills.  Medicine to help  reduce itching. HOME CARE INSTRUCTIONS  Take medicines only as directed by your health care provider.  Apply dandruff shampoo to the affected area if told to do so by your health care provider. You may be instructed to scrub the affected skin for several minutes each day.  Do not scratch the affected area of skin.  Avoid hot and humid conditions.  Do not use tanning booths.  Try to avoid sweating a lot. SEEK MEDICAL CARE IF:  Your symptoms get worse.  You have a fever.  You have redness, swelling, or pain at the site of your rash.  You have fluid, blood, or pus coming from your rash.  Your rash returns after treatment.   This information is not intended to replace advice given to you by your health care provider. Make sure you discuss any questions you have with your health care provider.   Document Released: 07/16/2000 Document Revised: 08/09/2014 Document Reviewed: 04/30/2014 Elsevier Interactive Patient Education 2016 Elsevier Inc.  

## 2016-01-09 NOTE — Assessment & Plan Note (Signed)
None recently Will monitor 

## 2016-01-09 NOTE — Progress Notes (Signed)
HPI  Pt presents to the clinic today to establish care and for management of the conditions listed below. She recently moved from [place].  Febrile Seizures: History of this as a child. None as an adult  Endometriosis: Controlled by IUD.  Anxiety: This is triggered by stressful home situation with her mother. It has resolved since she moved out. She has a RX for Ativan but reports she does not use it.  She reports a rash that occurs on her back. This only occurs during the summer. It is not itchy. It is scaly. She has seen a dermatologist in the past who has given her antifungal pills and yeast medication but it has not helped.  Flu: 05/2015 Tetanus: 2016 LMP: 10/2015, G0 Pap Smear: 12/2015- normal Dentist: Madilyn Fireman  Past Medical History  Diagnosis Date  . Febrile seizure (HCC)   . UTI (lower urinary tract infection)   . Endometriosis     dx by symptoms, no surgery  . Bartholin cyst     Current Outpatient Prescriptions  Medication Sig Dispense Refill  . levonorgestrel (MIRENA) 20 MCG/24HR IUD 1 each by Intrauterine route once.     No current facility-administered medications for this visit.    No Known Allergies  Family History  Problem Relation Age of Onset  . Cancer Mother   . Epilepsy Father   . Diabetes Maternal Grandmother   . Heart disease Maternal Grandmother   . Cancer Maternal Grandmother   . Diabetes Maternal Grandfather   . Heart disease Maternal Grandfather   . Diabetes Paternal Grandmother   . Heart disease Paternal Grandmother   . Diabetes Paternal Grandfather   . Heart disease Paternal Grandfather   . Hyperlipidemia Paternal Grandfather     Social History   Social History  . Marital Status: Married    Spouse Name: N/A  . Number of Children: N/A  . Years of Education: N/A   Occupational History  . Not on file.   Social History Main Topics  . Smoking status: Never Smoker   . Smokeless tobacco: Not on file  . Alcohol Use: Yes      Comment: social  . Drug Use: No  . Sexual Activity: Yes    Birth Control/ Protection: IUD   Other Topics Concern  . Not on file   Social History Narrative    ROS:  Constitutional: Denies fever, malaise, fatigue, headache or abrupt weight changes.  HEENT: Denies eye pain, eye redness, ear pain, ringing in the ears, wax buildup, runny nose, nasal congestion, bloody nose, or sore throat. Respiratory: Denies difficulty breathing, shortness of breath, cough or sputum production.   Cardiovascular: Denies chest pain, chest tightness, palpitations or swelling in the hands or feet.  Gastrointestinal: Denies abdominal pain, bloating, constipation, diarrhea or blood in the stool.  GU: Denies frequency, urgency, pain with urination, blood in urine, odor or discharge. Musculoskeletal: Denies decrease in range of motion, difficulty with gait, muscle pain or joint pain and swelling.  Skin: Pt reports rash. Denies redness, lesions or ulcercations.  Neurological: Denies dizziness, difficulty with memory, difficulty with speech or problems with balance and coordination.  Psych: Denies anxiety, depression, SI/HI.  No other specific complaints in a complete review of systems (except as listed in HPI above).  PE:  BP 92/60 mmHg  Pulse 79  Temp(Src) 98.6 F (37 C) (Oral)  Ht  (1.6 m)  Wt 151 lb 12 oz (68.833 kg)  BMI 26.89 kg/m2  SpO2 98%  LMP 09/28/2015 Wt  Readings from Last 3 Encounters:  01/09/16 151 lb 12 oz (68.833 kg)  12/26/15 152 lb (68.947 kg)  04/23/15 157 lb (71.215 kg)    General: Appears her stated age, well developed, well nourished in NAD. Skin: Convalescent scaly macular rash noted covering most of her back.  Cardiovascular: Normal rate and rhythm. S1,S2 noted.  No murmur, rubs or gallops noted.  Pulmonary/Chest: Normal effort and positive vesicular breath sounds. No respiratory distress. No wheezes, rales or ronchi noted.   Neurological: Alert and oriented.   Psychiatric: Mood and affect normal. Behavior is normal. Judgment and thought content normal.     Assessment and Plan:  Tinea Versicolor:  eRx for Selenium Sulfide lotion, apply for 7 days If no improvement, will refer to derm  RTC in 1 year for annual exam

## 2016-01-09 NOTE — Assessment & Plan Note (Signed)
Improved with avoidance of triggers She will hold on to Ativan to take if needed Will continue to monitor

## 2016-01-09 NOTE — Progress Notes (Signed)
Pre visit review using our clinic review tool, if applicable. No additional management support is needed unless otherwise documented below in the visit note. 

## 2016-01-13 ENCOUNTER — Telehealth: Payer: Self-pay

## 2016-01-13 DIAGNOSIS — B360 Pityriasis versicolor: Secondary | ICD-10-CM

## 2016-01-13 MED ORDER — SELENIUM SULFIDE 2.5 % EX LOTN: 1.0000 "application " | TOPICAL_LOTION | Freq: Every day | CUTANEOUS | Status: DC | PRN

## 2016-01-13 NOTE — Telephone Encounter (Signed)
Pt said selenium sulfide foam with ins cost to pt is $75.00; that is too expensive for pt and pt request more affordable substitute [facility] to [facility]. Advised pt to contact ins co for names of more affordable substitutes. Pt said she does not have time to do that she is a [employer] employee that works 3rd shift and she should be asleep now. Pt will cb when she has time.

## 2016-01-14 NOTE — Telephone Encounter (Signed)
Error to send foam instead of lotion... New Rx for the lotion sent to pharmacy, should be cheaper will await pt to call back to see if that will suffice

## 2017-10-26 ENCOUNTER — Encounter (INDEPENDENT_AMBULATORY_CARE_PROVIDER_SITE_OTHER): Payer: Self-pay

## 2017-10-26 ENCOUNTER — Encounter: Payer: Self-pay | Admitting: Physician Assistant

## 2017-10-26 ENCOUNTER — Ambulatory Visit (INDEPENDENT_AMBULATORY_CARE_PROVIDER_SITE_OTHER): Payer: No Typology Code available for payment source | Admitting: Physician Assistant

## 2017-10-26 VITALS — BP 113/74 | HR 85 | Ht 64.0 in | Wt 153.0 lb

## 2017-10-26 DIAGNOSIS — Z13220 Encounter for screening for lipoid disorders: Secondary | ICD-10-CM | POA: Diagnosis not present

## 2017-10-26 DIAGNOSIS — Z1329 Encounter for screening for other suspected endocrine disorder: Secondary | ICD-10-CM | POA: Diagnosis not present

## 2017-10-26 DIAGNOSIS — F411 Generalized anxiety disorder: Secondary | ICD-10-CM | POA: Diagnosis not present

## 2017-10-26 DIAGNOSIS — Z7689 Persons encountering health services in other specified circumstances: Secondary | ICD-10-CM | POA: Diagnosis not present

## 2017-10-26 DIAGNOSIS — Z130 Encounter for screening for diseases of the blood and blood-forming organs and certain disorders involving the immune mechanism: Secondary | ICD-10-CM | POA: Diagnosis not present

## 2017-10-26 DIAGNOSIS — Z131 Encounter for screening for diabetes mellitus: Secondary | ICD-10-CM

## 2017-10-26 MED ORDER — ALPRAZOLAM 0.25 MG PO TABS: 0.2500 mg | ORAL_TABLET | Freq: Two times a day (BID) | ORAL | 0 refills | Status: DC | PRN

## 2017-10-26 MED ORDER — ESCITALOPRAM OXALATE 20 MG PO TABS: ORAL_TABLET | ORAL | 1 refills | Status: DC

## 2017-10-26 MED FILL — ALPRAZolam 0.25 MG TABS: 0.25 | 5 days supply | Qty: 20 | Fill #0

## 2017-10-26 MED FILL — ESCITALOPRAM 20 MG TABLET: 20 | 34 days supply | Qty: 30 | Fill #0

## 2017-10-26 NOTE — Patient Instructions (Addendum)
LABS: The lab is a walk-in open M-F 7:30a-4:30p (closed 12:30-1:30p). Nothing to eat or drink after midnight or at least 8 hours before your blood draw. You can have water and your medications.   FOR ANXIETY: - start Escitalopram 1/2 tab daily for 1 week, then increase to full tab daily - do not use Xanax for sleep. Reserve for severe anxiety/panic attacks - follow-up in 2-4 weeks  ADDITIONAL RESOURCES Counseling: - psychologytoday.com: search engine to locate local counselors - Family Services in your county offer counseling on a sliding scale (pay what you can afford) - [facility]: we can place a referral for you to see one of licensed counselors in [place], [place], or [place] - online counseling: BetterHelp and [employer] (not covered by insurance, but affordable self-pay rates)  Other resources: - [place].ateverydayhealth.com/depression/guide/resources/ - 7cupsoftea - https://malone.com/greatist.com/grow/resources-when-you-can-not-afford-therapy  Safety Plan: if having self-harm or suicidal thoughts Our Office 628-626-8364614-014-6494 [facility] Crisis Hotline 804-491-9890(581)816-2349 National Suicide Hotline 1-800-SUICIDE If in immediate danger of harming yourself, go to the nearest emergency room or call 911

## 2017-10-26 NOTE — Progress Notes (Signed)
HPI:                                                                Brittany Carter is a 27 y.o. female who presents to [facility] today to establish care  Current concerns include: anxiety  Anxiety  Presents for initial visit. Onset was more than 5 years ago. The problem has been gradually worsening. Symptoms include excessive worry, irritability and nervous/anxious behavior. Patient reports no compulsions, depressed mood, insomnia, obsessions, panic or suicidal ideas. Symptoms occur most days. Nothing aggravates the symptoms. The quality of sleep is good. Nighttime awakenings: none.   Risk factors include family history. Her past medical history is significant for anxiety/panic attacks. Past treatments include benzodiazephines and SSRIs. The treatment provided moderate relief. Compliance with prior treatments has been good.  Prior meds: Sertraline (not very effective), Alprazolam    Depression screen PHQ 2/9 01/09/2016  Decreased Interest 0  Down, Depressed, Hopeless 0  PHQ - 2 Score 0    GAD 7 : Generalized Anxiety Score 10/26/2017  Nervous, Anxious, on Edge 2  Control/stop worrying 2  Worry too much - different things 2  Trouble relaxing 1  Restless 0  Easily annoyed or irritable 1  Afraid - awful might happen 1  Total GAD 7 Score 9      Past Medical History:  Diagnosis Date  . Bartholin cyst   . Endometriosis    dx by symptoms, no surgery  . Febrile seizure (HCC)   . UTI (lower urinary tract infection)    Past Surgical History:  Procedure Laterality Date  . SHOULDER SURGERY    . TONSILLECTOMY     Social History   Tobacco Use  . Smoking status: Never Smoker  . Smokeless tobacco: Never Used  Substance Use Topics  . Alcohol use: Yes    Comment: social   family history includes Cancer in her maternal grandmother and mother; Depression in her mother; Diabetes in her maternal grandfather, maternal grandmother,  paternal grandfather, and paternal grandmother; Epilepsy in her father; Heart disease in her maternal grandfather, maternal grandmother, paternal grandfather, and paternal grandmother; Hyperlipidemia in her father and paternal grandfather; Skin cancer in her sister.    ROS: negative except as noted in the HPI  Medications: Current Outpatient Medications  Medication Sig Dispense Refill  . levonorgestrel (MIRENA) 20 MCG/24HR IUD 1 each by Intrauterine route once.    Marland Kitchen MELATONIN PO Take by mouth.    . ALPRAZolam (XANAX) 0.25 MG tablet Take 1-2 tablets (0.25-0.5 mg total) by mouth every 12 (twelve) hours as needed for anxiety. 20 tablet 0  . escitalopram (LEXAPRO) 20 MG tablet One half tab daily for a week then one tab by mouth daily 30 tablet 1   No current facility-administered medications for this visit.    Allergies  Allergen Reactions  . Tape Rash       Objective:  BP 113/74   Pulse 85   Ht 5\' 4"  (1.626 m)   Wt 153 lb (69.4 kg)   LMP 10/13/2017   BMI 26.26 kg/m  Gen:  alert, not ill-appearing, no distress, appropriate for age HEENT: head normocephalic without obvious abnormality, conjunctiva and cornea clear, trachea midline Pulm: Normal work of breathing,  normal phonation Neuro: alert and oriented x 3, no tremor MSK: extremities atraumatic, normal gait and station Skin: intact, no rashes on exposed skin, no jaundice, no cyanosis Psych: well-groomed, cooperative, good eye contact, euthymic mood, affect mood-congruent, speech is articulate, and thought processes clear and goal-directed    No results found for this or any previous visit (from the past 72 hour(s)). No results found.    Assessment and Plan: 27 y.o. female with   Establish care - reviewed PMH, PSH, PFH, meds, allergies, HM - Pap smear UTD - immunizations UTD  1. Generalized anxiety disorder - PHQ2=0, GAD7=9, moderate - non-responder to Sertraline in the past. We discussed common to have medication  failure. Trial Lexapro, self-titrate to 20 mg. Small supply of benzo prn, Limit to severe anxiety. Instructed not to use for sleep - escitalopram (LEXAPRO) 20 MG tablet; One half tab daily for a week then one tab by mouth daily  Dispense: 30 tablet; Refill: 1 - ALPRAZolam (XANAX) 0.25 MG tablet; Take 1-2 tablets (0.25-0.5 mg total) by mouth every 12 (twelve) hours as needed for anxiety.  Dispense: 20 tablet; Refill: 0  2. Screening for lipid disorders - Lipid Panel With LDL/HDL Ratio  3. Screening for blood disease - CBC - Comprehensive metabolic panel  4. Screening for diabetes mellitus  5. Screening for thyroid disorder - TSH + free T4   Patient education and anticipatory guidance given Patient agrees with treatment plan Follow-up in 2-4 weeks for anxiety or sooner as needed if symptoms worsen or fail to improve  Levonne Hubertharley E. Onesha Krebbs PA-C

## 2017-11-21 ENCOUNTER — Ambulatory Visit (INDEPENDENT_AMBULATORY_CARE_PROVIDER_SITE_OTHER): Payer: No Typology Code available for payment source | Admitting: Physician Assistant

## 2017-11-21 ENCOUNTER — Encounter: Payer: Self-pay | Admitting: Physician Assistant

## 2017-11-21 VITALS — BP 114/78 | HR 83 | Temp 98.3°F | Wt 152.0 lb

## 2017-11-21 DIAGNOSIS — J302 Other seasonal allergic rhinitis: Secondary | ICD-10-CM | POA: Insufficient documentation

## 2017-11-21 DIAGNOSIS — F411 Generalized anxiety disorder: Secondary | ICD-10-CM

## 2017-11-21 DIAGNOSIS — J0100 Acute maxillary sinusitis, unspecified: Secondary | ICD-10-CM

## 2017-11-21 MED ORDER — ESCITALOPRAM OXALATE 20 MG PO TABS: 20.0000 mg | ORAL_TABLET | Freq: Every day | ORAL | 1 refills | Status: DC

## 2017-11-21 MED ORDER — FLUTICASONE PROPIONATE 50 MCG/ACT NA SUSP
2.0000 | Freq: Every day | NASAL | 6 refills | Status: DC
Start: 2017-11-21 — End: 2018-05-16

## 2017-11-21 MED ORDER — MONTELUKAST SODIUM 10 MG PO TABS
10.0000 mg | ORAL_TABLET | Freq: Every day | ORAL | 3 refills | Status: DC
Start: 2017-11-21 — End: 2018-05-16

## 2017-11-21 MED FILL — ESCITALOPRAM 20 MG TABLET: 20 | 90 days supply | Qty: 90 | Fill #0

## 2017-11-21 MED FILL — FLUTICASONE PROP 50 MCG SPR: 50 | 30 days supply | Qty: 16 | Fill #0

## 2017-11-21 MED FILL — MONTELUKAST SOD 10 MG TAB: 10 | 90 days supply | Qty: 90 | Fill #0

## 2017-11-21 NOTE — Progress Notes (Signed)
HPI:                                                                Brittany MartinSarah Ann Carter is a 27 y.o. female who [facility] today for anxiety follow-up  Current concerns: allergies  Depression/Anxiety: taking Lexapro 20 mg nightly without difficulty. Reports both husband and herself have noticed improvement in her anxiety. Still having symptoms, but they are less severe. Has only needed xanax 2-3 times. Sleep quality is fair-good. Denies adverse effects of the medication. Denies depressed mood/anhedonia. Denies symptoms of mania/hypomania. Denies suicidal thinking. Denies auditory/visual hallucinations.  Reports right sided facial pressure, rhinorrhea, post-nasal drip x 4 days. Taking Zyrtec and using nasal saline irrigation. Denies fever, chills, wheezing, cough.   [facility] 11/21/2017 01/09/2016  Decreased Interest 0 0  Down, Depressed, Hopeless 0 0  PHQ - 2 Score 0 0  Altered sleeping 1 -  Tired, decreased energy 0 -  Change in appetite 0 -  Feeling bad or failure about yourself  0 -  Trouble concentrating 0 -  Moving slowly or fidgety/restless 0 -  Suicidal thoughts 0 -  PHQ-9 Score 1 -    GAD 7 : Generalized Anxiety Score 11/21/2017 10/26/2017  Nervous, Anxious, on Edge 1 2  Control/stop worrying 1 2  Worry too much - different things 1 2  Trouble relaxing 1 1  Restless 0 0  Easily annoyed or irritable 1 1  Afraid - awful might happen 1 1  Total GAD 7 Score 6 9      Past Medical History:  Diagnosis Date  . Bartholin cyst   . Endometriosis    dx by symptoms, no surgery  . Febrile seizure (HCC)   . UTI (lower urinary tract infection)    Past Surgical History:  Procedure Laterality Date  . SHOULDER SURGERY    . TONSILLECTOMY     Social History   Tobacco Use  . Smoking status: Never Smoker  . Smokeless tobacco: Never Used  Substance Use Topics  . Alcohol use: Yes    Comment: social    family history includes Cancer in her maternal grandmother and mother; Depression in her mother; Diabetes in her maternal grandfather, maternal grandmother, paternal grandfather, and paternal grandmother; Epilepsy in her father; Heart disease in her maternal grandfather, maternal grandmother, paternal grandfather, and paternal grandmother; Hyperlipidemia in her father and paternal grandfather; Skin cancer in her sister.    ROS: negative except as noted in the HPI  Medications: Current Outpatient Medications  Medication Sig Dispense Refill  . ALPRAZolam (XANAX) 0.25 MG tablet Take 1-2 tablets (0.25-0.5 mg total) by mouth every 12 (twelve) hours as needed for anxiety. 20 tablet 0  . escitalopram (LEXAPRO) 20 MG tablet One half tab daily for a week then one tab by mouth daily 30 tablet 1  . levonorgestrel (MIRENA) 20 MCG/24HR IUD 1 each by Intrauterine route once.    Marland Kitchen. MELATONIN PO Take by mouth.     No current facility-administered medications for this visit.    Allergies  Allergen Reactions  . Tape Rash       Objective:  BP 114/78   Pulse 83   Temp 98.3 F (36.8 C) (Oral)  Wt 152 lb (68.9 kg)   BMI 26.09 kg/m  Gen:  alert, not ill-appearing, no distress, appropriate for age HEENT: head normocephalic without obvious abnormality, conjunctiva and cornea clear, TM's pearly gray and semi-transparent, nasal mucosa edematous, oropharynx clear, there is right maxillary sinus tenderness, neck supple, no cervical adenopathy, trachea midline Pulm: Normal work of breathing, normal phonation, clear to auscultation bilaterally, no wheezes, rales or rhonchi CV: Normal rate, regular rhythm, s1 and s2 distinct, no murmurs, clicks or rubs  Neuro: alert and oriented x 3, no tremor MSK: extremities atraumatic, normal gait and station Skin: intact, no rashes on exposed skin, no jaundice, no cyanosis Psych: well-groomed, cooperative, good eye contact, euthymic mood, affect mood-congruent, speech  is articulate, and thought processes clear and goal-directed    No results found for this or any previous visit (from the past 72 hour(s)). No results found.    Assessment and Plan: 27 y.o. female with   1. Generalized anxiety disorder - GAD7=6, mild, responding well to Lexapro. Continue 20 mg nightly. Continue xanax 0.25-0.5 prn for severe anxiety/panic attack. Follow-up in 6 months or sooner as needed - escitalopram (LEXAPRO) 20 MG tablet; Take 1 tablet (20 mg total) by mouth at bedtime. One half tab daily for a week then one tab by mouth daily  Dispense: 90 tablet; Refill: 1  2. Seasonal allergic rhinitis, unspecified trigger - cont Zyrtec and nasal saline irrigation. Add Flonase and Singulair  3. Acute non-recurrent maxillary sinusitis - 4 days of symptoms. Likely viral sinusitis or allergic rhinitis. Recommend aggressive nasal toileting, antihistamine and symptomatic care. If symptoms persist after 10 days, will treat empirically with antibiotics.   Patient education and anticipatory guidance given Patient agrees with treatment plan Follow-up in 6 months for anxiety or sooner as needed if symptoms worsen or fail to improve  Levonne Hubert PA-C

## 2018-01-11 ENCOUNTER — Other Ambulatory Visit: Payer: Self-pay | Admitting: Physician Assistant

## 2018-01-11 DIAGNOSIS — F411 Generalized anxiety disorder: Secondary | ICD-10-CM

## 2018-01-16 ENCOUNTER — Encounter: Payer: Self-pay | Admitting: Physician Assistant

## 2018-01-16 MED ORDER — ALPRAZOLAM 0.25 MG PO TABS: 0.2500 mg | ORAL_TABLET | Freq: Two times a day (BID) | ORAL | 0 refills | Status: DC | PRN

## 2018-01-16 MED FILL — ALPRAZolam 0.25 MG TABS: 0.25 | 10 days supply | Qty: 20 | Fill #0

## 2018-01-26 ENCOUNTER — Encounter: Payer: No Typology Code available for payment source | Admitting: Physician Assistant

## 2018-01-30 ENCOUNTER — Ambulatory Visit (INDEPENDENT_AMBULATORY_CARE_PROVIDER_SITE_OTHER): Payer: No Typology Code available for payment source | Admitting: Advanced Practice Midwife

## 2018-01-30 ENCOUNTER — Encounter: Payer: Self-pay | Admitting: Advanced Practice Midwife

## 2018-01-30 ENCOUNTER — Encounter: Payer: Self-pay | Admitting: Physician Assistant

## 2018-01-30 ENCOUNTER — Ambulatory Visit (INDEPENDENT_AMBULATORY_CARE_PROVIDER_SITE_OTHER): Payer: No Typology Code available for payment source | Admitting: Physician Assistant

## 2018-01-30 VITALS — BP 115/75 | HR 86 | Ht 63.0 in | Wt 158.0 lb

## 2018-01-30 VITALS — BP 109/73 | HR 80 | Wt 158.0 lb

## 2018-01-30 DIAGNOSIS — B360 Pityriasis versicolor: Secondary | ICD-10-CM | POA: Insufficient documentation

## 2018-01-30 DIAGNOSIS — Z6827 Body mass index (BMI) 27.0-27.9, adult: Secondary | ICD-10-CM | POA: Insufficient documentation

## 2018-01-30 DIAGNOSIS — E875 Hyperkalemia: Secondary | ICD-10-CM | POA: Diagnosis not present

## 2018-01-30 DIAGNOSIS — Z124 Encounter for screening for malignant neoplasm of cervix: Secondary | ICD-10-CM | POA: Diagnosis not present

## 2018-01-30 DIAGNOSIS — Z13220 Encounter for screening for lipoid disorders: Secondary | ICD-10-CM

## 2018-01-30 DIAGNOSIS — Z130 Encounter for screening for diseases of the blood and blood-forming organs and certain disorders involving the immune mechanism: Secondary | ICD-10-CM

## 2018-01-30 DIAGNOSIS — R17 Unspecified jaundice: Secondary | ICD-10-CM | POA: Diagnosis not present

## 2018-01-30 DIAGNOSIS — Z0000 Encounter for general adult medical examination without abnormal findings: Secondary | ICD-10-CM | POA: Diagnosis not present

## 2018-01-30 DIAGNOSIS — Z131 Encounter for screening for diabetes mellitus: Secondary | ICD-10-CM | POA: Diagnosis not present

## 2018-01-30 DIAGNOSIS — Z1329 Encounter for screening for other suspected endocrine disorder: Secondary | ICD-10-CM | POA: Diagnosis not present

## 2018-01-30 DIAGNOSIS — Z01419 Encounter for gynecological examination (general) (routine) without abnormal findings: Secondary | ICD-10-CM

## 2018-01-30 DIAGNOSIS — Z975 Presence of (intrauterine) contraceptive device: Secondary | ICD-10-CM

## 2018-01-30 LAB — COMPREHENSIVE METABOLIC PANEL
AG RATIO: 2.2 (calc) (ref 1.0–2.5)
ALBUMIN MSPROF: 4.8 g/dL (ref 3.6–5.1)
ALT: 13 U/L (ref 6–29)
AST: 16 U/L (ref 10–30)
Alkaline phosphatase (APISO): 45 U/L (ref 33–115)
BUN: 10 mg/dL (ref 7–25)
CO2: 28 mmol/L (ref 20–32)
Calcium: 9.8 mg/dL (ref 8.6–10.2)
Chloride: 106 mmol/L (ref 98–110)
Creat: 0.62 mg/dL (ref 0.50–1.10)
GLOBULIN: 2.2 g/dL (ref 1.9–3.7)
GLUCOSE: 95 mg/dL (ref 65–99)
Potassium: 5.4 mmol/L — ABNORMAL HIGH (ref 3.5–5.3)
SODIUM: 140 mmol/L (ref 135–146)
TOTAL PROTEIN: 7 g/dL (ref 6.1–8.1)
Total Bilirubin: 1.5 mg/dL — ABNORMAL HIGH (ref 0.2–1.2)

## 2018-01-30 LAB — LIPID PANEL W/REFLEX DIRECT LDL
Cholesterol: 173 mg/dL (ref <200)
HDL: 65 mg/dL (ref >50)
LDL Cholesterol (Calc): 94 mg/dL (calc)
Non-HDL Cholesterol (Calc): 108 mg/dL (calc) (ref <130)
TRIGLYCERIDES: 54 mg/dL (ref <150)
Total CHOL/HDL Ratio: 2.7 (calc) (ref <5.0)

## 2018-01-30 LAB — TSH+FREE T4: TSH W/REFLEX TO FT4: 1.85 mIU/L

## 2018-01-30 LAB — CBC
HCT: 38.7 % (ref 35.0–45.0)
HEMOGLOBIN: 12.8 g/dL (ref 11.7–15.5)
MCH: 30.3 pg (ref 27.0–33.0)
MCHC: 33.1 g/dL (ref 32.0–36.0)
MCV: 91.7 fL (ref 80.0–100.0)
MPV: 11.2 fL (ref 7.5–12.5)
Platelets: 199 10*3/uL (ref 140–400)
RBC: 4.22 10*6/uL (ref 3.80–5.10)
RDW: 12 % (ref 11.0–15.0)
WBC: 6.3 10*3/uL (ref 3.8–10.8)

## 2018-01-30 MED ORDER — FLUCONAZOLE 150 MG PO TABS
ORAL_TABLET | ORAL | 0 refills | Status: DC
Start: 2018-01-30 — End: 2018-03-20

## 2018-01-30 NOTE — Progress Notes (Signed)
Patient ID: Brittany Carter, female   DOB: 14-Jan-1991, 27 y.o.   MRN: [id number] GYNECOLOGY ANNUAL PREVENTATIVE CARE ENCOUNTER NOTE  Subjective:   Brittany MartinSarah Ann Carter is a 27 y.o. G0P0000 female here for a routine annual gynecologic exam.  Current complaints: More PMS symptoms last couple of months. Has Mirena. But may want to get it replaced before December when due.  Has not had children, but did not have Cytotec before placement last time, which made it difficult.  Denies abnormal vaginal bleeding, discharge, pelvic pain, problems with intercourse or other gynecologic concerns.   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  Thinking of going to NP school   Gynecologic History Patient's last menstrual period was 12/20/2017. Contraception: IUD Last Pap: 2017. Results were: normal   Wants to go ahead and get pap today Last mammogram: none  Obstetric History OB History  Gravida Para Term Preterm AB Living  0 0 0 0 0 0  SAB TAB Ectopic Multiple Live Births  0 0 0 0 0    Past Medical History:  Diagnosis Date  . Bartholin cyst   . Endometriosis    dx by symptoms, no surgery  . Febrile seizure (HCC)   . UTI (lower urinary tract infection)     Past Surgical History:  Procedure Laterality Date  . SHOULDER SURGERY    . TONSILLECTOMY      Current Outpatient Medications on File Prior to Visit  Medication Sig Dispense Refill  . ALPRAZolam (XANAX) 0.25 MG tablet Take 1-2 tablets (0.25-0.5 mg total) by mouth every 12 (twelve) hours as needed for anxiety. 20 tablet 0  . cetirizine (ZYRTEC) 10 MG tablet Take 10 mg by mouth at bedtime.    Marland Kitchen. escitalopram (LEXAPRO) 20 MG tablet Take 1 tablet (20 mg total) by mouth at bedtime. 90 tablet 1  . levonorgestrel (MIRENA) 20 MCG/24HR IUD 1 each by Intrauterine route once.    Marland Kitchen. MELATONIN PO Take by mouth.    . fluticasone (FLONASE) 50 MCG/ACT nasal spray Place 2 sprays into both nostrils daily. 16 g 6  . montelukast (SINGULAIR) 10 MG tablet Take 1 tablet (10 mg  total) by mouth at bedtime. 90 tablet 3   No current facility-administered medications on file prior to visit.     Allergies  Allergen Reactions  . Tape Rash    Social History   Socioeconomic History  . Marital status: Married    Spouse name: Not on file  . Number of children: Not on file  . Years of education: Not on file  . Highest education level: Not on file  Occupational History  . Not on file  Social Needs  . Financial resource strain: Not on file  . Food insecurity:    Worry: Not on file    Inability: Not on file  . Transportation needs:    Medical: Not on file    Non-medical: Not on file  Tobacco Use  . Smoking status: Never Smoker  . Smokeless tobacco: Never Used  Substance and Sexual Activity  . Alcohol use: Yes    Comment: social  . Drug use: No  . Sexual activity: Yes    Birth control/protection: IUD  Lifestyle  . Physical activity:    Days per week: Not on file    Minutes per session: Not on file  . Stress: Not on file  Relationships  . Social connections:    Talks on phone: Not on file    Gets together: Not on  file    Attends religious service: Not on file    Active member of club or organization: Not on file    Attends meetings of clubs or organizations: Not on file    Relationship status: Not on file  . Intimate partner violence:    Fear of current or ex partner: Not on file    Emotionally abused: Not on file    Physically abused: Not on file    Forced sexual activity: Not on file  Other Topics Concern  . Not on file  Social History Narrative  . Not on file    Family History  Problem Relation Age of Onset  . Cancer Mother        cervical  . Depression Mother   . Epilepsy Father   . Hyperlipidemia Father   . Skin cancer Sister   . Diabetes Maternal Grandmother   . Heart disease Maternal Grandmother   . Cancer Maternal Grandmother   . Diabetes Maternal Grandfather   . Heart disease Maternal Grandfather   . Diabetes Paternal  Grandmother   . Heart disease Paternal Grandmother   . Diabetes Paternal Grandfather   . Heart disease Paternal Grandfather   . Hyperlipidemia Paternal Grandfather     The following portions of the patient's history were reviewed and updated as appropriate: allergies, current medications, past family history, past medical history, past social history, past surgical history and problem list.  Review of Systems Constitutional: negative for chills, fatigue, fevers and malaise Cardiovascular: negative for chest pain and dyspnea Gastrointestinal: negative for abdominal pain and change in bowel habits Genitourinary:negative for abnormal menstrual periods and vaginal discharge Integument/breast: negative for breast lump and positive for tenderness Behavioral/Psych: negative for abusive relationship, anxiety and depression   Objective:  BP 115/75   Pulse 86   Ht 5\' 3"  (1.6 m)   Wt 158 lb (71.7 kg)   LMP 12/20/2017   BMI 27.99 kg/m  CONSTITUTIONAL: Well-developed, well-nourished female in no acute distress.  HENT:  Normocephalic, atraumatic,Oropharynx is clear and moist EYES: Conjunctivae and EOM are normal.  No scleral icterus.  NECK: Normal range of motion, supple, no masses.  Normal thyroid.  SKIN: Skin is warm and dry. No rash noted. Not diaphoretic. No erythema. No pallor. NEUROLOGIC: Alert and oriented to person, place, and time. Normal reflexes, muscle tone coordination. No cranial nerve deficit noted. PSYCHIATRIC: Normal mood and affect. Normal behavior. Normal judgment and thought content. CARDIOVASCULAR: Normal heart rate noted, regular rhythm RESPIRATORY: Clear to auscultation bilaterally. Effort and breath sounds normal, no problems with respiration noted. BREASTS: Symmetric in size. No masses, skin changes, nipple drainage, or lymphadenopathy. ABDOMEN: Soft, normal bowel sounds, no distention noted.  No tenderness, rebound or guarding.  PELVIC: Normal appearing external  genitalia; normal appearing vaginal mucosa and cervix.  No abnormal discharge noted.  Pap smear obtained.  Normal uterine size, no other palpable masses, no uterine or adnexal tenderness. MUSCULOSKELETAL: Normal range of motion. No tenderness.  No cyanosis, clubbing, or edema.  2+ distal pulses.   Assessment:  Annual gynecologic examination with pap smear Mirena IUD, nearly expired   Plan:  Will follow up results of pap smear and manage accordingly. Routine preventative health maintenance measures emphasized.  Rx put in for Cytotec to be used night before IUD placement May schedule IUD replacement when desired  Please refer to After Visit Summary for other counseling recommendations.

## 2018-01-30 NOTE — Patient Instructions (Signed)
Preventive Care 18-39 Years, Female Preventive care refers to lifestyle choices and visits with your health care provider that can promote health and wellness. What does preventive care include?  A yearly physical exam. This is also called an annual well check.  Dental exams once or twice a year.  Routine eye exams. Ask your health care provider how often you should have your eyes checked.  Personal lifestyle choices, including: ? Daily care of your teeth and gums. ? Regular physical activity. ? Eating a healthy diet. ? Avoiding tobacco and drug use. ? Limiting alcohol use. ? Practicing safe sex. ? Taking vitamin and mineral supplements as recommended by your health care provider. What happens during an annual well check? The services and screenings done by your health care provider during your annual well check will depend on your age, overall health, lifestyle risk factors, and family history of disease. Counseling Your health care provider may ask you questions about your:  Alcohol use.  Tobacco use.  Drug use.  Emotional well-being.  Home and relationship well-being.  Sexual activity.  Eating habits.  Work and work Statistician.  Method of birth control.  Menstrual cycle.  Pregnancy history.  Screening You may have the following tests or measurements:  Height, weight, and BMI.  Diabetes screening. This is done by checking your blood sugar (glucose) after you have not eaten for a while (fasting).  Blood pressure.  Lipid and cholesterol levels. These may be checked every 5 years starting at age 66.  Skin check.  Hepatitis C blood test.  Hepatitis B blood test.  Sexually transmitted disease (STD) testing.  BRCA-related cancer screening. This may be done if you have a family history of breast, ovarian, tubal, or peritoneal cancers.  Pelvic exam and Pap test. This may be done every 3 years starting at age 40. Starting at age 59, this may be done every 5  years if you have a Pap test in combination with an HPV test.  Discuss your test results, treatment options, and if necessary, the need for more tests with your health care provider. Vaccines Your health care provider may recommend certain vaccines, such as:  Influenza vaccine. This is recommended every year.  Tetanus, diphtheria, and acellular pertussis (Tdap, Td) vaccine. You may need a Td booster every 10 years.  Varicella vaccine. You may need this if you have not been vaccinated.  HPV vaccine. If you are 69 or younger, you may need three doses over 6 months.  Measles, mumps, and rubella (MMR) vaccine. You may need at least one dose of MMR. You may also need a second dose.  Pneumococcal 13-valent conjugate (PCV13) vaccine. You may need this if you have certain conditions and were not previously vaccinated.  Pneumococcal polysaccharide (PPSV23) vaccine. You may need one or two doses if you smoke cigarettes or if you have certain conditions.  Meningococcal vaccine. One dose is recommended if you are age 27-21 years and a first-year college student living in a residence hall, or if you have one of several medical conditions. You may also need additional booster doses.  Hepatitis A vaccine. You may need this if you have certain conditions or if you travel or work in places where you may be exposed to hepatitis A.  Hepatitis B vaccine. You may need this if you have certain conditions or if you travel or work in places where you may be exposed to hepatitis B.  Haemophilus influenzae type b (Hib) vaccine. You may need this if  you have certain risk factors.  Talk to your health care provider about which screenings and vaccines you need and how often you need them. This information is not intended to replace advice given to you by your health care provider. Make sure you discuss any questions you have with your health care provider. Document Released: 09/14/2001 Document Revised: 04/07/2016  Document Reviewed: 05/20/2015 Elsevier Interactive Patient Education  [employer].

## 2018-01-30 NOTE — Progress Notes (Signed)
Wants to discuss birth control options

## 2018-01-30 NOTE — Patient Instructions (Signed)
Health Maintenance, Female Adopting a healthy lifestyle and getting preventive care can go a long way to promote health and wellness. Talk with your health care provider about what schedule of regular examinations is right for you. This is a good chance for you to check in with your provider about disease prevention and staying healthy. In between checkups, there are plenty of things you can do on your own. Experts have done a lot of research about which lifestyle changes and preventive measures are most likely to keep you healthy. Ask your health care provider for more information. Weight and diet Eat a healthy diet  Be sure to include plenty of vegetables, fruits, low-fat dairy products, and lean protein.  Do not eat a lot of foods high in solid fats, added sugars, or salt.  Get regular exercise. This is one of the most important things you can do for your health. ? Most adults should exercise for at least 150 minutes each week. The exercise should increase your heart rate and make you sweat (moderate-intensity exercise). ? Most adults should also do strengthening exercises at least twice a week. This is in addition to the moderate-intensity exercise.  Maintain a healthy weight  Body mass index (BMI) is a measurement that can be used to identify possible weight problems. It estimates body fat based on height and weight. Your health care provider can help determine your BMI and help you achieve or maintain a healthy weight.  For females 85 years of age and older: ? A BMI below 18.5 is considered underweight. ? A BMI of 18.5 to 24.9 is normal. ? A BMI of 25 to 29.9 is considered overweight. ? A BMI of 30 and above is considered obese.  Watch levels of cholesterol and blood lipids  You should start having your blood tested for lipids and cholesterol at 25 years of age, then have this test every 5 years.  You may need to have your cholesterol levels checked more often if: ? Your lipid or  cholesterol levels are high. ? You are older than 25 years of age. ? You are at high risk for heart disease.  Cancer screening Lung Cancer  Lung cancer screening is recommended for adults 72-56 years old who are at high risk for lung cancer because of a history of smoking.  A yearly low-dose CT scan of the lungs is recommended for people who: ? Currently smoke. ? Have quit within the past 15 years. ? Have at least a 30-pack-year history of smoking. A pack year is smoking an average of one pack of cigarettes a day for 1 year.  Yearly screening should continue until it has been 15 years since you quit.  Yearly screening should stop if you develop a health problem that would prevent you from having lung cancer treatment.  Breast Cancer  Practice breast self-awareness. This means understanding how your breasts normally appear and feel.  It also means doing regular breast self-exams. Let your health care provider know about any changes, no matter how small.  If you are in your 20s or 30s, you should have a clinical breast exam (CBE) by a health care provider every 1-3 years as part of a regular health exam.  If you are 64 or older, have a CBE every year. Also consider having a breast X-ray (mammogram) every year.  If you have a family history of breast cancer, talk to your health care provider about genetic screening.  If you are at high risk  for breast cancer, talk to your health care provider about having an MRI and a mammogram every year.  Breast cancer gene (BRCA) assessment is recommended for women who have family members with BRCA-related cancers. BRCA-related cancers include: ? Breast. ? Ovarian. ? Tubal. ? Peritoneal cancers.  Results of the assessment will determine the need for genetic counseling and BRCA1 and BRCA2 testing.  Cervical Cancer Your health care provider may recommend that you be screened regularly for cancer of the pelvic organs (ovaries, uterus, and  vagina). This screening involves a pelvic examination, including checking for microscopic changes to the surface of your cervix (Pap test). You may be encouraged to have this screening done every 3 years, beginning at age 22.  For women ages 56-65, health care providers may recommend pelvic exams and Pap testing every 3 years, or they may recommend the Pap and pelvic exam, combined with testing for human papilloma virus (HPV), every 5 years. Some types of HPV increase your risk of cervical cancer. Testing for HPV may also be done on women of any age with unclear Pap test results.  Other health care providers may not recommend any screening for nonpregnant women who are considered low risk for pelvic cancer and who do not have symptoms. Ask your health care provider if a screening pelvic exam is right for you.  If you have had past treatment for cervical cancer or a condition that could lead to cancer, you need Pap tests and screening for cancer for at least 20 years after your treatment. If Pap tests have been discontinued, your risk factors (such as having a new sexual partner) need to be reassessed to determine if screening should resume. Some women have medical problems that increase the chance of getting cervical cancer. In these cases, your health care provider may recommend more frequent screening and Pap tests.  Colorectal Cancer  This type of cancer can be detected and often prevented.  Routine colorectal cancer screening usually begins at 25 years of age and continues through 25 years of age.  Your health care provider may recommend screening at an earlier age if you have risk factors for colon cancer.  Your health care provider may also recommend using home test kits to check for hidden blood in the stool.  A small camera at the end of a tube can be used to examine your colon directly (sigmoidoscopy or colonoscopy). This is done to check for the earliest forms of colorectal  cancer.  Routine screening usually begins at age 33.  Direct examination of the colon should be repeated every 5-10 years through 25 years of age. However, you may need to be screened more often if early forms of precancerous polyps or small growths are found.  Skin Cancer  Check your skin from head to toe regularly.  Tell your health care provider about any new moles or changes in moles, especially if there is a change in a mole's shape or color.  Also tell your health care provider if you have a mole that is larger than the size of a pencil eraser.  Always use sunscreen. Apply sunscreen liberally and repeatedly throughout the day.  Protect yourself by wearing long sleeves, pants, a wide-brimmed hat, and sunglasses whenever you are outside.  Heart disease, diabetes, and high blood pressure  High blood pressure causes heart disease and increases the risk of stroke. High blood pressure is more likely to develop in: ? People who have blood pressure in the high end of  the normal range (130-139/85-89 mm Hg). ? People who are overweight or obese. ? People who are African American.  If you are 21-29 years of age, have your blood pressure checked every 3-5 years. If you are 3 years of age or older, have your blood pressure checked every year. You should have your blood pressure measured twice-once when you are at a hospital or clinic, and once when you are not at a hospital or clinic. Record the average of the two measurements. To check your blood pressure when you are not at a hospital or clinic, you can use: ? An automated blood pressure machine at a pharmacy. ? A home blood pressure monitor.  If you are between 17 years and 37 years old, ask your health care provider if you should take aspirin to prevent strokes.  Have regular diabetes screenings. This involves taking a blood sample to check your fasting blood sugar level. ? If you are at a normal weight and have a low risk for diabetes,  have this test once every three years after 25 years of age. ? If you are overweight and have a high risk for diabetes, consider being tested at a younger age or more often. Preventing infection Hepatitis B  If you have a higher risk for hepatitis B, you should be screened for this virus. You are considered at high risk for hepatitis B if: ? You were born in a country where hepatitis B is common. Ask your health care provider which countries are considered high risk. ? Your parents were born in a high-risk country, and you have not been immunized against hepatitis B (hepatitis B vaccine). ? You have HIV or AIDS. ? You use needles to inject street drugs. ? You live with someone who has hepatitis B. ? You have had sex with someone who has hepatitis B. ? You get hemodialysis treatment. ? You take certain medicines for conditions, including cancer, organ transplantation, and autoimmune conditions.  Hepatitis C  Blood testing is recommended for: ? Everyone born from [birth year] through 1963. ? Anyone with known risk factors for hepatitis C.  Sexually transmitted infections (STIs)  You should be screened for sexually transmitted infections (STIs) including gonorrhea and chlamydia if: ? You are sexually active and are younger than 25 years of age. ? You are older than 25 years of age and your health care provider tells you that you are at risk for this type of infection. ? Your sexual activity has changed since you were last screened and you are at an increased risk for chlamydia or gonorrhea. Ask your health care provider if you are at risk.  If you do not have HIV, but are at risk, it may be recommended that you take a prescription medicine daily to prevent HIV infection. This is called pre-exposure prophylaxis (PrEP). You are considered at risk if: ? You are sexually active and do not regularly use condoms or know the HIV status of your partner(s). ? You take drugs by injection. ? You are  sexually active with a partner who has HIV.  Talk with your health care provider about whether you are at high risk of being infected with HIV. If you choose to begin PrEP, you should first be tested for HIV. You should then be tested every 3 months for as long as you are taking PrEP. Pregnancy  If you are premenopausal and you may become pregnant, ask your health care provider about preconception counseling.  If you may become  pregnant, take 400 to 800 micrograms (mcg) of folic acid every day.  If you want to prevent pregnancy, talk to your health care provider about birth control (contraception). Osteoporosis and menopause  Osteoporosis is a disease in which the bones lose minerals and strength with aging. This can result in serious bone fractures. Your risk for osteoporosis can be identified using a bone density scan.  If you are 65 years of age or older, or if you are at risk for osteoporosis and fractures, ask your health care provider if you should be screened.  Ask your health care provider whether you should take a calcium or vitamin D supplement to lower your risk for osteoporosis.  Menopause may have certain physical symptoms and risks.  Hormone replacement therapy may reduce some of these symptoms and risks. Talk to your health care provider about whether hormone replacement therapy is right for you. Follow these instructions at home:  Schedule regular health, dental, and eye exams.  Stay current with your immunizations.  Do not use any tobacco products including cigarettes, chewing tobacco, or electronic cigarettes.  If you are pregnant, do not drink alcohol.  If you are breastfeeding, limit how much and how often you drink alcohol.  Limit alcohol intake to no more than 1 drink per day for nonpregnant women. One drink equals 12 ounces of beer, 5 ounces of wine, or 1 ounces of hard liquor.  Do not use street drugs.  Do not share needles.  Ask your health care  provider for help if you need support or information about quitting drugs.  Tell your health care provider if you often feel depressed.  Tell your health care provider if you have ever been abused or do not feel safe at home. This information is not intended to replace advice given to you by your health care provider. Make sure you discuss any questions you have with your health care provider. Document Released: 02/01/2011 Document Revised: 12/25/2015 Document Reviewed: 04/22/2015 Elsevier Interactive Patient Education  2018 [employer] Levonorgestrel intrauterine device (IUD) What is this medicine? LEVONORGESTREL IUD (LEE voe nor jes trel) is a contraceptive (birth control) device. The device is placed inside the uterus by a healthcare professional. It is used to prevent pregnancy. This device can also be used to treat heavy bleeding that occurs during your period. This medicine may be used for other purposes; ask your health care provider or pharmacist if you have questions. COMMON BRAND NAME(S): Kyleena, LILETTA, Mirena, Skyla What should I tell my health care provider before I take this medicine? They need to know if you have any of these conditions: -abnormal Pap smear -cancer of the breast, uterus, or cervix -diabetes -endometritis -genital or pelvic infection now or in the past -have more than one sexual partner or your partner has more than one partner -heart disease -history of an ectopic or tubal pregnancy -immune system problems -IUD in place -liver disease or tumor -problems with blood clots or take blood-thinners -seizures -use intravenous drugs -uterus of unusual shape -vaginal bleeding that has not been explained -an unusual or allergic reaction to levonorgestrel, other hormones, silicone, or polyethylene, medicines, foods, dyes, or preservatives -pregnant or trying to get pregnant -breast-feeding How should I use this medicine? This device is placed inside the  uterus by a health care professional. Talk to your pediatrician regarding the use of this medicine in children. Special care may be needed. Overdosage: If you think you have taken too much of this medicine contact a   poison control center or emergency room at once. NOTE: This medicine is only for you. Do not share this medicine with others. What if I miss a dose? This does not apply. Depending on the brand of device you have inserted, the device will need to be replaced every 3 to 5 years if you wish to continue using this type of birth control. What may interact with this medicine? Do not take this medicine with any of the following medications: -amprenavir -bosentan -fosamprenavir This medicine may also interact with the following medications: -aprepitant -armodafinil -barbiturate medicines for inducing sleep or treating seizures -bexarotene -boceprevir -griseofulvin -medicines to treat seizures like carbamazepine, ethotoin, felbamate, oxcarbazepine, phenytoin, topiramate -modafinil -pioglitazone -rifabutin -rifampin -rifapentine -some medicines to treat HIV infection like atazanavir, efavirenz, indinavir, lopinavir, nelfinavir, tipranavir, ritonavir -St. John's wort -warfarin This list may not describe all possible interactions. Give your health care provider a list of all the medicines, herbs, non-prescription drugs, or dietary supplements you use. Also tell them if you smoke, drink alcohol, or use illegal drugs. Some items may interact with your medicine. What should I watch for while using this medicine? Visit your doctor or health care professional for regular check ups. See your doctor if you or your partner has sexual contact with others, becomes HIV positive, or gets a sexual transmitted disease. This product does not protect you against HIV infection (AIDS) or other sexually transmitted diseases. You can check the placement of the IUD yourself by reaching up to the top of  your vagina with clean fingers to feel the threads. Do not pull on the threads. It is a good habit to check placement after each menstrual period. Call your doctor right away if you feel more of the IUD than just the threads or if you cannot feel the threads at all. The IUD may come out by itself. You may become pregnant if the device comes out. If you notice that the IUD has come out use a backup birth control method like condoms and call your health care provider. Using tampons will not change the position of the IUD and are okay to use during your period. This IUD can be safely scanned with magnetic resonance imaging (MRI) only under specific conditions. Before you have an MRI, tell your healthcare provider that you have an IUD in place, and which type of IUD you have in place. What side effects may I notice from receiving this medicine? Side effects that you should report to your doctor or health care professional as soon as possible: -allergic reactions like skin rash, itching or hives, swelling of the face, lips, or tongue -fever, flu-like symptoms -genital sores -high blood pressure -no menstrual period for 6 weeks during use -pain, swelling, warmth in the leg -pelvic pain or tenderness -severe or sudden headache -signs of pregnancy -stomach cramping -sudden shortness of breath -trouble with balance, talking, or walking -unusual vaginal bleeding, discharge -yellowing of the eyes or skin Side effects that usually do not require medical attention (report to your doctor or health care professional if they continue or are bothersome): -acne -breast pain -change in sex drive or performance -changes in weight -cramping, dizziness, or faintness while the device is being inserted -headache -irregular menstrual bleeding within first 3 to 6 months of use -nausea This list may not describe all possible side effects. Call your doctor for medical advice about side effects. You may report side  effects to FDA at 1-800-FDA-1088. Where should I keep my medicine? This does  not apply. NOTE: This sheet is a summary. It may not cover all possible information. If you have questions about this medicine, talk to your doctor, pharmacist, or health care provider.  2018 Elsevier/Gold Standard (2016-04-30 14:14:56)

## 2018-01-30 NOTE — Progress Notes (Signed)
HPI:                                                                Brittany Carter is a 27 y.o. female who presents to [facility] today for routine annual physical exam  Reports recurrence of tinea on her chest and back. Rash is pruritic when she sweats, otherwise asymptomatic. Has tried topical antifungals without cure.   Depression screen [facility] 2/9 01/30/2018 11/21/2017 01/09/2016  Decreased Interest 0 0 0  Down, Depressed, Hopeless 0 0 0  PHQ - 2 Score 0 0 0  Altered sleeping - 1 -  Tired, decreased energy - 0 -  Change in appetite - 0 -  Feeling bad or failure about yourself  - 0 -  Trouble concentrating - 0 -  Moving slowly or fidgety/restless - 0 -  Suicidal thoughts - 0 -  PHQ-9 Score - 1 -    GAD 7 : Generalized Anxiety Score 11/21/2017 10/26/2017  Nervous, Anxious, on Edge 1 2  Control/stop worrying 1 2  Worry too much - different things 1 2  Trouble relaxing 1 1  Restless 0 0  Easily annoyed or irritable 1 1  Afraid - awful might happen 1 1  Total GAD 7 Score 6 9      Past Medical History:  Diagnosis Date  . Bartholin cyst   . Endometriosis    dx by symptoms, no surgery  . Febrile seizure (HCC)   . UTI (lower urinary tract infection)    Past Surgical History:  Procedure Laterality Date  . SHOULDER SURGERY    . TONSILLECTOMY     Social History   Tobacco Use  . Smoking status: Never Smoker  . Smokeless tobacco: Never Used  Substance Use Topics  . Alcohol use: Yes    Comment: social   family history includes Cancer in her maternal grandmother and mother; Depression in her mother; Diabetes in her maternal grandfather, maternal grandmother, paternal grandfather, and paternal grandmother; Epilepsy in her father; Heart disease in her maternal grandfather, maternal grandmother, paternal grandfather, and paternal grandmother; Hyperlipidemia in her father and paternal grandfather; Skin cancer in her  sister.    ROS: negative except as noted in the HPI  Medications: Current Outpatient Medications  Medication Sig Dispense Refill  . ALPRAZolam (XANAX) 0.25 MG tablet Take 1-2 tablets (0.25-0.5 mg total) by mouth every 12 (twelve) hours as needed for anxiety. 20 tablet 0  . cetirizine (ZYRTEC) 10 MG tablet Take 10 mg by mouth at bedtime.    Marland Kitchen escitalopram (LEXAPRO) 20 MG tablet Take 1 tablet (20 mg total) by mouth at bedtime. 90 tablet 1  . fluticasone (FLONASE) 50 MCG/ACT nasal spray Place 2 sprays into both nostrils daily. 16 g 6  . levonorgestrel (MIRENA) 20 MCG/24HR IUD 1 each by Intrauterine route once.    Marland Kitchen MELATONIN PO Take by mouth.    . montelukast (SINGULAIR) 10 MG tablet Take 1 tablet (10 mg total) by mouth at bedtime. 90 tablet 3  . fluconazole (DIFLUCAN) 150 MG tablet 300 mg PO once, repeat in 2 weeks 4 tablet 0   No current facility-administered medications for this visit.    Allergies  Allergen Reactions  . Tape Rash  Objective:  BP 109/73   Pulse 80   Wt 158 lb (71.7 kg)   BMI 27.99 kg/m  General Appearance:  Alert, cooperative, no distress, appropriate for age                            Head:  Normocephalic, without obvious abnormality                             Eyes:  PERRL, EOM's intact, conjunctiva and cornea clear                             Ears:  TM pearly gray color and semitransparent, external ear canals normal, both ears                            Nose:  Nares symmetrical                          Throat:  Lips, tongue, and mucosa are moist, pink, and intact; good dentition                             Neck:  Supple; symmetrical, trachea midline, no adenopathy; thyroid: no enlargement, symmetric, no tenderness/mass/nodules                             Back:  Symmetrical, no curvature, ROM normal               Chest/Breast:  deferred                           Lungs:  Clear to auscultation bilaterally, respirations unlabored                              Heart:  regular rate & normal rhythm, S1 and S2 normal, no murmurs, rubs, or gallops                     Abdomen:  Soft, non-tender, no mass or organomegaly              Genitourinary:  deferred         Musculoskeletal:  Tone and strength strong and symmetrical, all extremities; no joint pain or edema, normal gait and station                                    Lymphatic:  No adenopathy             Skin/Hair/Nails:  Skin warm, dry and intact, diffuse hyperpigmented rash in christmas tree distribution on the trunk                   Neurologic:  Alert and oriented x3, no cranial nerve deficits, DTR's intact, sensation grossly intact, normal gait and station, no tremor Psych: well-groomed, cooperative, good eye contact, euthymic mood, affect mood-congruent, speech is articulate, and thought processes clear and goal-directed   No results found for this or any previous visit (from the past 72 hour(s)). No results  found.    Assessment and Plan: 27 y.o. female with   Encounter for annual physical exam - Plan: CBC, Comprehensive metabolic panel, Lipid Panel w/reflex Direct LDL, TSH + free T4  Screening for blood disease - Plan: CBC, Comprehensive metabolic panel  Screening for diabetes mellitus - Plan: Comprehensive metabolic panel  Screening for lipid disorders - Plan: Lipid Panel w/reflex Direct LDL  Screening for thyroid disorder - Plan: TSH + free T4  BMI 27.0-27.9,adult  Tinea versicolor - Plan: fluconazole (DIFLUCAN) 150 MG tablet  - Personally reviewed PMH, PSH, PFH, medications, allergies, HM - Age-appropriate cancer screening: Pap UTD - Influenza n/a - Tdap UTD per patient - PHQ2 negative - Counseled on heart healthy diet, regular aerobic exercise   Patient education and anticipatory guidance given Patient agrees with treatment plan Follow-up in 1 year or sooner as needed if symptoms worsen or fail to improve  Levonne Hubert PA-C

## 2018-01-31 ENCOUNTER — Encounter: Payer: Self-pay | Admitting: Physician Assistant

## 2018-01-31 DIAGNOSIS — R17 Unspecified jaundice: Secondary | ICD-10-CM | POA: Insufficient documentation

## 2018-01-31 DIAGNOSIS — E875 Hyperkalemia: Secondary | ICD-10-CM | POA: Insufficient documentation

## 2018-01-31 LAB — CYTOLOGY - PAP
Adequacy: ABSENT
Diagnosis: NEGATIVE

## 2018-01-31 NOTE — Addendum Note (Signed)
Addended by: Gena FrayUMMINGS, CHARLEY E on: 01/31/2018 12:01 PM   Modules accepted: Orders

## 2018-02-08 MED FILL — FLUCONAZOLE 150 MG TABS: 150 | 14 days supply | Qty: 4 | Fill #0

## 2018-02-22 ENCOUNTER — Other Ambulatory Visit: Payer: Self-pay | Admitting: *Deleted

## 2018-02-22 DIAGNOSIS — E875 Hyperkalemia: Secondary | ICD-10-CM

## 2018-02-23 LAB — BASIC METABOLIC PANEL
BUN: 9 mg/dL (ref 7–25)
CALCIUM: 9.3 mg/dL (ref 8.6–10.2)
CO2: 28 mmol/L (ref 20–32)
Chloride: 104 mmol/L (ref 98–110)
Creat: 0.6 mg/dL (ref 0.50–1.10)
GLUCOSE: 91 mg/dL (ref 65–99)
Potassium: 4.4 mmol/L (ref 3.5–5.3)
SODIUM: 138 mmol/L (ref 135–146)

## 2018-02-23 MED FILL — ESCITALOPRAM 20 MG TABLET: 20 | 90 days supply | Qty: 90 | Fill #1

## 2018-02-27 MED FILL — AMOXICILLIN 500 MG CAPSULE: 500 | 10 days supply | Qty: 30 | Fill #0

## 2018-03-13 ENCOUNTER — Telehealth: Payer: No Typology Code available for payment source | Admitting: Family

## 2018-03-13 DIAGNOSIS — T63441A Toxic effect of venom of bees, accidental (unintentional), initial encounter: Secondary | ICD-10-CM | POA: Diagnosis not present

## 2018-03-13 MED ORDER — PREDNISONE 10 MG (21) PO TBPK: ORAL_TABLET | ORAL | 0 refills | Status: DC

## 2018-03-13 MED FILL — predniSONE 10 MG TABS: 10 | 6 days supply | Qty: 21 | Fill #0

## 2018-03-13 NOTE — Progress Notes (Signed)
E Visit for Insect Sting  Thank you for describing the insect sting for us.  Here is how we plan to help!  A sting that we will treat with a short course of prednisone.  The 2 greatest risks from insect stings are allergic reaction, which can be fatal in some people and infection, which is more common and less serious.  Bees, wasps, yellow jackets, and hornets belong to a class of insects called Hymenoptera.  Most insect stings cause only minor discomfort.  Stings can happen anywhere on the body and can be painful.  Most stings are from honey bees or yellow jackets.  Fire ants can sting multiple times.  The sites of the stings are more likely to become infected.    I have sent in prednisone 10 mg tapering dose for 5 days to the pharmacy you selected.  Please make sure that you selected a pharmacy that is open now.  What can be used to prevent Insect Stings?   Insect repellant with at least 20% DEET.    Wearing long pants and shirts with socks and shoes.    Wear dark or drab-colored clothes rather than bright colors.    Avoid using perfumes and hair sprays; these attract insects.  HOME CARE ADVICE:  1. Stinger removal:  The stinger looks like a tiny black dot in the sting.  Use a fingernail, credit card edge, or knife-edge to scrape it off.  Don't pull it out because it squeezes out more venom.  If the stinger is below the skin surface, leave it alone.  It will be shed with normal skin healing. 2. Use cold compresses to the area of the sting for 10-20 minutes.  You may repeat this as needed to relieve symptoms of pain and swelling. 3.  For pain relief, take acetominophen 650 mg 4-6 hours as needed or ibuprofen 400 mg every 6-8 hours as needed or naproxen 250-500 mg every 12 hours as needed. 4.  You can also use hydrocortisone cream 0.5% or 1% up to 4 times daily as needed for itching. 5.  If the sting becomes very itchy, take Benadryl 25-50 mg, follow directions on box. 6.   Wash the area 2-3 times daily with antibacterial soap and warm water. 7. Call your Doctor if:  Fever, a severe headache, or rash occur in the next 2 weeks.  Sting area begins to look infected.  Redness and swelling worsens after home treatment.  Your current symptoms become worse.    MAKE SURE YOU:   Understand these instructions.  Will watch your condition.  Will get help right away if you are not doing well or get worse.  Thank you for choosing an e-visit. Your e-visit answers were reviewed by a board certified advanced clinical practitioner to complete your personal care plan. Depending upon the condition, your plan could have included both over the counter or prescription medications. Please review your pharmacy choice. Be sure that the pharmacy you have chosen is open so that you can pick up your prescription now.  If there is a problem you may message your provider in MyChart to have the prescription routed to another pharmacy. Your safety is important to us. If you have drug allergies check your prescription carefully.  For the next 24 hours, you can use MyChart to ask questions about today's visit, request a non-urgent call back, or ask for a work or school excuse from your e-visit provider. You will get an email in the next   two days asking about your experience. I hope that your e-visit has been valuable and will speed your recovery.    

## 2018-03-20 ENCOUNTER — Ambulatory Visit (INDEPENDENT_AMBULATORY_CARE_PROVIDER_SITE_OTHER): Payer: No Typology Code available for payment source | Admitting: Physician Assistant

## 2018-03-20 ENCOUNTER — Encounter: Payer: Self-pay | Admitting: Physician Assistant

## 2018-03-20 VITALS — BP 116/80 | HR 66 | Wt 163.0 lb

## 2018-03-20 DIAGNOSIS — T63481A Toxic effect of venom of other arthropod, accidental (unintentional), initial encounter: Secondary | ICD-10-CM | POA: Diagnosis not present

## 2018-03-20 MED ORDER — EPINEPHRINE 0.3 MG/0.3ML IJ SOAJ: 0.3000 mg | Freq: Once | INTRAMUSCULAR | 3 refills | Status: AC | PRN

## 2018-03-20 MED FILL — EPINEPHRINE 0.3 MG AUTO-INJ: 0.3 | 30 days supply | Qty: 2 | Fill #0

## 2018-03-20 NOTE — Progress Notes (Signed)
HPI:                                                                Brittany MartinSarah Ann Carter is a 27 y.o. female [facility] today for insect bite follow-up  Patient was stung by a wasp 1 week ago and reports developing sudden onset swelling, itching, redness and some itching on the inside of her cheek. She self-treated with Benadryl and Pepcid. The swelling and itching spread across her entire upper thigh, she completed an e-visit and she was started on a Prednisone taper. Symptoms have improved tremendously since starting steroids. Denies prior history of anaphylaxis to insect bites or food.   Past Medical History:  Diagnosis Date  . Bartholin cyst   . Endometriosis    dx by symptoms, no surgery  . Febrile seizure (HCC)   . UTI (lower urinary tract infection)    Past Surgical History:  Procedure Laterality Date  . SHOULDER SURGERY    . TONSILLECTOMY     Social History   Tobacco Use  . Smoking status: Never Smoker  . Smokeless tobacco: Never Used  Substance Use Topics  . Alcohol use: Yes    Comment: social   family history includes Cancer in her maternal grandmother and mother; Depression in her mother; Diabetes in her maternal grandfather, maternal grandmother, paternal grandfather, and paternal grandmother; Epilepsy in her father; Heart disease in her maternal grandfather, maternal grandmother, paternal grandfather, and paternal grandmother; Hyperlipidemia in her father and paternal grandfather; Skin cancer in her sister.    ROS: negative except as noted in the HPI  Medications: Current Outpatient Medications  Medication Sig Dispense Refill  . ALPRAZolam (XANAX) 0.25 MG tablet Take 1-2 tablets (0.25-0.5 mg total) by mouth every 12 (twelve) hours as needed for anxiety. 20 tablet 0  . escitalopram (LEXAPRO) 20 MG tablet Take 1 tablet (20 mg total) by mouth at bedtime. 90 tablet 1  . fluticasone (FLONASE) 50 MCG/ACT  nasal spray Place 2 sprays into both nostrils daily. 16 g 6  . levonorgestrel (MIRENA) 20 MCG/24HR IUD 1 each by Intrauterine route once.    Marland Kitchen. MELATONIN PO Take by mouth.    . montelukast (SINGULAIR) 10 MG tablet Take 1 tablet (10 mg total) by mouth at bedtime. 90 tablet 3   No current facility-administered medications for this visit.    Allergies  Allergen Reactions  . Tape Rash       Objective:  BP 116/80   Pulse 66   Wt 163 lb (73.9 kg)   BMI 28.87 kg/m  Gen:  alert, not ill-appearing, no distress, appropriate for age HEENT: head normocephalic without obvious abnormality, conjunctiva and cornea clear, trachea midline Pulm: Normal work of breathing, normal phonation Neuro: alert and oriented x 3, no tremor MSK: extremities atraumatic, normal gait and station Skin: left proximal thigh there is approx 2 mm papule with central ulceration Psych: well-groomed, cooperative, good eye contact, euthymic mood, affect mood-congruent, speech is articulate, and thought processes clear and goal-directed    No results found for this or any previous visit (from the past 72 hour(s)). No results found.    Assessment and Plan: 27 y.o. 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    Patient education and anticipatory guidance given Patient agrees with treatment plan Follow-up as needed if symptoms worsen or fail to improve  Levonne Hubertharley E. Ivyrose Hashman PA-C

## 2018-03-20 NOTE — Patient Instructions (Signed)
Anaphylactic Reaction, Adult An anaphylactic reaction (anaphylaxis) is a sudden, severe allergic reaction that affects multiple areas of the body. Affected areas of the body may include the skin, mouth, lungs, heart, or gut (digestive system). Anaphylaxis can be life-threatening. This condition requires immediate medical attention, and sometimes hospitalization. What are the causes? This condition is caused by exposure to a substance that you are allergic to (allergen). In response to this exposure, the body releases proteins (antibodies) and other compounds, such as histamine, into the bloodstream. This causes swelling in certain tissues and loss of blood pressure to important areas, such as the heart and lungs. Common allergens that can cause anaphylaxis include:  Medicines.  Foods, especially peanuts, wheat, shellfish, milk, and eggs.  Insect bites or stings.  Blood or parts of blood, including plasma, immunoglobulins, or serum.  Chemicals, such as dyes, latex, and contrast material that is used for medical tests.  What increases the risk? This condition is more likely to occur in people who:  Have allergies.  Have had anaphylaxis before.  Have a family history of anaphylaxis.  Have certain medical conditions, including asthma and eczema.  What are the signs or symptoms? Symptoms of anaphylaxis include:  Nasal congestion.  Headache.  Flushed face.  Tingling in the mouth.  An itchy, red rash.  Swelling of the eyes, lips, face, or tongue.  Swelling of the back of the mouth and the throat.  Wheezing.  A hoarse voice.  Itchy, red, swollen areas of skin (hives).  Dizziness or light-headedness.  Fainting.  Anxiety or confusion.  Abdominal or chest pain.  Difficulty breathing, speaking, or swallowing.  Chest or throat tightness.  Fast or irregular heartbeats (palpitations).  Vomiting.  Diarrhea.  How is this diagnosed? This condition is diagnosed based  on a physical exam and your history of recent exposure to allergens. You may be referred for follow-up testing by a health care provider who specializes in allergies. This testing can confirm the diagnosis and determine which substances you are allergic to. Testing may include:  Skin tests. These may involve: ? Injecting a small amount of the possible allergen between layers of your skin (intradermal injection). ? Applying patches to your skin.  Blood tests.  How is this treated? Emergency treatment may include:  Medicines that help: ? To relieve itching and hives (antihistamines). ? To reduce swelling (corticosteroids). ? To tighten your blood vessels and increase your heart rate (epinephrine).  Oxygen therapy to help you breathe.  Giving fluids through an IV tube.  Your health care provider may teach you how to use an anaphylaxis kit and how to give yourself an epinephrine injection with what is commonly called an auto-injector "pen" (pre-filled automatic epinephrine injection device). If you think that you are having an anaphylactic reaction, you should use an auto-injector pen or an anaphylaxis kit. If you use epinephrine, you must still seek emergency medical treatment. Follow these instructions at home: Safety  Always keep an auto-injector pen or an anaphylaxis kit near you. These can be lifesaving if you have a severe anaphylactic reaction. Use your auto-injector pen or anaphylaxis kit as told by your health care provider.  Do not drive until your health care provider approves.  Make sure that you, the members of your household, and your employer know: ? How to use an anaphylaxis kit. ? How to use an auto-injector pen to give you an epinephrine injection.  Replace your epinephrine immediately after you use your auto-injector pen, in case you have  another reaction.  Wear a medical alert bracelet or necklace that states your allergy, if told by your health care  provider.  Learn the signs of anaphylaxis.  Work with your health care providers to come up with an anaphylaxis plan. Preparation is important. General instructions  Take over-the-counter and prescription medicines only as told by your health care provider.  If you have hives or a rash: ? Use an over-the-counter antihistamine as told by your health care provider. ? Apply cold, wet cloths (cold compresses) to your skin or take baths or showers in cool water. Avoid hot water.  If you had tests done, it is your responsibility to get your test results. Ask your health care provider or the department performing the tests when your results will be ready.  Tell any health care providers who care for you that you have an allergy.  Keep all follow-up visits as told by your health care provider. This is important. How is this prevented?  Avoid allergens that have caused an anaphylactic reaction for you before.  When you are at a restaurant, tell your server that you have an allergy. If you are unsure whether a meal has an ingredient that you are allergic to, ask your server. Contact a health care provider if:  You develop symptoms of an allergic reaction. You may notice them soon after you are exposed to a substance. The symptoms may include: ? Rash. ? Headache. ? Sneezing or a runny nose. ? Swelling. ? Nausea. ? Diarrhea. Get help right away if:  You needed to use epinephrine. ? An epinephrine injection helps to manage life-threatening allergic reactions, but you still need to go to the emergency room even if epinephrine seems to work. This is important because anaphylaxis may happen again within 72 hours (rebound anaphylaxis). ? If you used epinephrine to treat anaphylaxis outside of the hospital, you need additional medical care. This may include more doses of epinephrine.  You develop: ? A tight feeling in your chest or throat. ? Wheezing or difficulty breathing. ? Hives. ? Red  skin or itching all over your body. ? Swelling in your lips, tongue, or the back of your throat.  You have severe vomiting or diarrhea.  You faint or you feel like you are going to faint. These symptoms may represent a serious problem that is an emergency. Do not wait to see if the symptoms will go away. Use your auto-injector pen or anaphylaxis kit as you have been instructed, and get medical help right away. Call your local emergency services (911 in the [place] Do not drive yourself to the hospital. This information is not intended to replace advice given to you by your health care provider. Make sure you discuss any questions you have with your health care provider. Document Released: 07/19/2005 Document Revised: 03/16/2016 Document Reviewed: 02/02/2016 Elsevier Interactive Patient Education  [employer].

## 2018-04-12 ENCOUNTER — Encounter: Payer: Self-pay | Admitting: Physician Assistant

## 2018-04-12 DIAGNOSIS — F411 Generalized anxiety disorder: Secondary | ICD-10-CM

## 2018-04-13 MED ORDER — ALPRAZOLAM 0.25 MG PO TABS: 0.2500 mg | ORAL_TABLET | Freq: Two times a day (BID) | ORAL | 2 refills | Status: DC | PRN

## 2018-04-13 MED FILL — ALPRAZolam 0.25 MG TABS: 0.25 | 5 days supply | Qty: 20 | Fill #0

## 2018-04-14 ENCOUNTER — Telehealth: Payer: Self-pay

## 2018-04-14 NOTE — Telephone Encounter (Signed)
Pharmacy is asking for clarification of alprazolam.  The script was sent on 9/12 they are wanting to make sure not to fill this script til Oct. Please advise. -EH/RMA

## 2018-04-14 NOTE — Telephone Encounter (Signed)
I'm not sure what they are clarifying She requested a refill on 9/11 and according to [facility] she filled it on 9/12 She should be able to fill 20 tabs every 3 months So they can put that prescription on file for December if needed

## 2018-05-16 ENCOUNTER — Ambulatory Visit (INDEPENDENT_AMBULATORY_CARE_PROVIDER_SITE_OTHER): Payer: No Typology Code available for payment source | Admitting: Certified Nurse Midwife

## 2018-05-16 ENCOUNTER — Encounter: Payer: Self-pay | Admitting: Certified Nurse Midwife

## 2018-05-16 VITALS — BP 123/79 | HR 87 | Resp 16 | Ht 63.0 in | Wt 169.0 lb

## 2018-05-16 DIAGNOSIS — Z30432 Encounter for removal of intrauterine contraceptive device: Secondary | ICD-10-CM

## 2018-05-16 NOTE — Progress Notes (Signed)
    GYNECOLOGY CLINIC PROCEDURE NOTE  Ms. Brittany Carter is a 27 y.o. G0P0000 here for Mirena IUD removal. No GYN concerns.  Last pap smear was on 01/30/2018 and was normal.  IUD Removal  Patient was in the dorsal lithotomy position, normal external genitalia was noted.  A speculum was placed in the patient's vagina, normal discharge was noted, no lesions. The cervix was visualized, no lesions, no abnormal discharge.  The strings of the IUD were grasped and pulled using ring forceps. The IUD was removed in its entirety. Patient tolerated the procedure well.    Patient plans for pregnancy soon and she was told to avoid teratogens, take PNV and folic acid.  Routine preventative health maintenance measures emphasized.  Sharyon Cable, CNM 05/16/2018 2:07 PM

## 2018-05-18 MED FILL — ALPRAZolam 0.25 MG TABS: 0.25 | 5 days supply | Qty: 20 | Fill #1

## 2018-05-23 ENCOUNTER — Ambulatory Visit (INDEPENDENT_AMBULATORY_CARE_PROVIDER_SITE_OTHER): Payer: No Typology Code available for payment source | Admitting: Physician Assistant

## 2018-05-23 ENCOUNTER — Encounter: Payer: Self-pay | Admitting: Physician Assistant

## 2018-05-23 VITALS — BP 116/78 | HR 73 | Wt 170.0 lb

## 2018-05-23 DIAGNOSIS — Z3169 Encounter for other general counseling and advice on procreation: Secondary | ICD-10-CM | POA: Diagnosis not present

## 2018-05-23 DIAGNOSIS — R17 Unspecified jaundice: Secondary | ICD-10-CM

## 2018-05-23 DIAGNOSIS — E875 Hyperkalemia: Secondary | ICD-10-CM

## 2018-05-23 DIAGNOSIS — F411 Generalized anxiety disorder: Secondary | ICD-10-CM

## 2018-05-23 MED ORDER — FLUOXETINE HCL 10 MG PO TABS: ORAL_TABLET | ORAL | 0 refills | Status: DC

## 2018-05-23 MED ORDER — ESCITALOPRAM OXALATE 5 MG PO TABS: ORAL_TABLET | ORAL | 0 refills | Status: DC

## 2018-05-23 MED FILL — FLUoxetine HCL 10 MG TABS: 10 | 35 days supply | Qty: 60 | Fill #0

## 2018-05-23 MED FILL — ESCITALOPRAM 5 MG TABLET: 5 | 17 days supply | Qty: 30 | Fill #0

## 2018-05-23 NOTE — Progress Notes (Signed)
HPI:                                                                Brittany Carter is a 27 y.o. female who presents to [facility]: Primary Care Sports Medicine today for preconception counseling   Patient is planning for pregnancy soon. G0P0.  She had her IUD removed last week. LMP was approx 3 weeks ago. Currently taking prenatal vitamin.  She is currently on Lexapro and Alprazolam for anxiety. This is working well for her and she would like to continue taking anxiety medication.  Prior meds: Sertraline - not effective  Depression screen [facility] 2/9 01/30/2018 11/21/2017 01/09/2016  Decreased Interest 0 0 0  Down, Depressed, Hopeless 0 0 0  PHQ - 2 Score 0 0 0  Altered sleeping - 1 -  Tired, decreased energy - 0 -  Change in appetite - 0 -  Feeling bad or failure about yourself  - 0 -  Trouble concentrating - 0 -  Moving slowly or fidgety/restless - 0 -  Suicidal thoughts - 0 -  PHQ-9 Score - 1 -    GAD 7 : Generalized Anxiety Score 11/21/2017 10/26/2017  Nervous, Anxious, on Edge 1 2  Control/stop worrying 1 2  Worry too much - different things 1 2  Trouble relaxing 1 1  Restless 0 0  Easily annoyed or irritable 1 1  Afraid - awful might happen 1 1  Total GAD 7 Score 6 9      Past Medical History:  Diagnosis Date  . Bartholin cyst   . Endometriosis    dx by symptoms, no surgery  . Febrile seizure (HCC)   . UTI (lower urinary tract infection)    Past Surgical History:  Procedure Laterality Date  . SHOULDER SURGERY    . TONSILLECTOMY     Social History   Tobacco Use  . Smoking status: Never Smoker  . Smokeless tobacco: Never Used  Substance Use Topics  . Alcohol use: Yes    Comment: social   family history includes Cancer in her maternal grandmother and mother; Depression in her mother; Diabetes in her maternal grandfather, maternal grandmother, paternal grandfather, and paternal grandmother; Epilepsy in her father; Heart disease in her  maternal grandfather, maternal grandmother, paternal grandfather, and paternal grandmother; Hyperlipidemia in her father and paternal grandfather; Skin cancer in her sister.    ROS: negative except as noted in the HPI  Medications: Current Outpatient Medications  Medication Sig Dispense Refill  . EPINEPHrine (EPIPEN 2-PAK) 0.3 mg/0.3 mL IJ SOAJ injection Inject 0.3 mLs (0.3 mg total) into the muscle once as needed for up to 1 dose. 2 Device 3  . MELATONIN PO Take by mouth.    . Prenatal Vit-Fe Fumarate-FA (PRENATAL VITAMIN PO) Take by mouth.    . escitalopram (LEXAPRO) 5 MG tablet Take 3 tablets (15 mg total) by mouth daily for 3 days, THEN 2 tablets (10 mg total) daily for 7 days, THEN 1 tablet (5 mg total) daily for 7 days. 30 tablet 0  . FLUoxetine (PROZAC) 10 MG tablet Take 0.5 tablets (5 mg total) by mouth at bedtime for 3 days, THEN 1 tablet (10 mg total) at bedtime for 7 days, THEN 2 tablets (20 mg  total) at bedtime for 20 days. 60 tablet 0   No current facility-administered medications for this visit.    Allergies  Allergen Reactions  . Wasp Venom Anaphylaxis  . Tape Rash       Objective:  BP 116/78   Pulse 73   Wt 170 lb (77.1 kg)   LMP 05/06/2018   BMI 30.11 kg/m  Gen:  alert, not ill-appearing, no distress, appropriate for age HEENT: head normocephalic without obvious abnormality, conjunctiva and cornea clear, trachea midline Pulm: Normal work of breathing, normal phonation Neuro: alert and oriented x 3, no tremor MSK: extremities atraumatic, normal gait and station Skin: intact, no rashes on exposed skin, no jaundice, no cyanosis Psych: well-groomed, cooperative, good eye contact, euthymic mood, affect mood-congruent, speech is articulate, and thought processes clear and goal-directed    No results found for this or any previous visit (from the past 72 hour(s)). No results found.    Assessment and Plan: 27 y.o. female with   .[name] was seen today for  medication management.  Diagnoses and all orders for this visit:  Pre-conception counseling  Hyperkalemia -     Renal Function Panel  Total bilirubin, elevated -     Hepatic function panel  Generalized anxiety disorder -     FLUoxetine (PROZAC) 10 MG tablet; Take 0.5 tablets (5 mg total) by mouth at bedtime for 3 days, THEN 1 tablet (10 mg total) at bedtime for 7 days, THEN 2 tablets (20 mg total) at bedtime for 20 days. -     escitalopram (LEXAPRO) 5 MG tablet; Take 3 tablets (15 mg total) by mouth daily for 3 days, THEN 2 tablets (10 mg total) daily for 7 days, THEN 1 tablet (5 mg total) daily for 7 days.  GAD - we discussed option to taper off of Lexapro and observe or cross-taper onto Fluoxetine. Patient opted for Fluoxetine cross-taper - we discussed relative risks of SSRI use in pregnancy. Fluoxetine has been the most well studied with few adverse effects and no malformations or defects. Discussed risk of gestational HTN, SAB and fetal growth impairment - discussed benzodiazepine should be avoided when possible - she will send MyChart message in 4 weeks regarding mood/anxiety symptoms - keep follow-up with OB    Patient education and anticipatory guidance given Patient agrees with treatment plan Follow-up as needed if symptoms worsen or fail to improve  Levonne Hubert PA-C

## 2018-05-23 NOTE — Patient Instructions (Signed)
Preparing for Pregnancy If you are considering becoming pregnant, make an appointment to see your regular health care provider to learn how to prepare for a safe and healthy pregnancy (preconception care). During a preconception care visit, your health care provider will:  Do a complete physical exam, including a Pap test.  Take a complete medical history.  Give you information, answer your questions, and help you resolve problems.  Preconception checklist Medical history  Tell your health care provider about any current or past medical conditions. Your pregnancy or your ability to become pregnant may be affected by chronic conditions, such as diabetes, chronic hypertension, and thyroid problems.  Include your family's medical history as well as your partner's medical history.  Tell your health care provider about any history of STIs (sexually transmitted infections).These can affect your pregnancy. In some cases, they can be passed to your baby. Discuss any concerns that you have about STIs.  If indicated, discuss the benefits of genetic testing. This testing will show whether there are any genetic conditions that may be passed from you or your partner to your baby.  Tell your health care provider about: ? Any problems you have had with conception or pregnancy. ? Any medicines you take. These include vitamins, herbal supplements, and over-the-counter medicines. ? Your history of immunizations. Discuss any vaccinations that you may need.  Diet  Ask your health care provider what to include in a healthy diet that has a balance of nutrients. This is especially important when you are pregnant or preparing to become pregnant.  Ask your health care provider to help you reach a healthy weight before pregnancy. ? If you are overweight, you may be at higher risk for certain complications, such as high blood pressure, diabetes, and preterm birth. ? If you are underweight, you are more likely  to have a baby who has a low birth weight.  Lifestyle, work, and home  Let your health care provider know: ? About any lifestyle habits that you have, such as alcohol use, drug use, or smoking. ? About recreational activities that may put you at risk during pregnancy, such as downhill skiing and certain exercise programs. ? Tell your health care provider about any international travel, especially any travel to places with an active Zika virus outbreak. ? About harmful substances that you may be exposed to at work or at home. These include chemicals, pesticides, radiation, or even litter boxes. ? If you do not feel safe at home.  Mental health  Tell your health care provider about: ? Any history of mental health conditions, including feelings of depression, sadness, or anxiety. ? Any medicines that you take for a mental health condition. These include herbs and supplements.  Home instructions to prepare for pregnancy Lifestyle  Eat a balanced diet. This includes fresh fruits and vegetables, whole grains, lean meats, low-fat dairy products, healthy fats, and foods that are high in fiber. Ask to meet with a nutritionist or registered dietitian for assistance with meal planning and goals.  Get regular exercise. Try to be active for at least 30 minutes a day on most days of the week. Ask your health care provider which activities are safe during pregnancy.  Do not use any products that contain nicotine or tobacco, such as cigarettes and e-cigarettes. If you need help quitting, ask your health care provider.  Do not drink alcohol.  Do not take illegal drugs.  Maintain a healthy weight. Ask your health care provider what weight range is   right for you.  General instructions  Keep an accurate record of your menstrual periods. This makes it easier for your health care provider to determine your baby's due date.  Begin taking prenatal vitamins and folic acid supplements daily as directed by  your health care provider.  Manage any chronic conditions, such as high blood pressure and diabetes, as told by your health care provider. This is important.  How do I know that I am pregnant? You may be pregnant if you have been sexually active and you miss your period. Symptoms of early pregnancy include:  Mild cramping.  Very light vaginal bleeding (spotting).  Feeling unusually tired.  Nausea and vomiting (morning sickness).  If you have any of these symptoms and you suspect that you might be pregnant, you can take a home pregnancy test. These tests check for a hormone in your urine (human chorionic gonadotropin, or hCG). A woman's body begins to make this hormone during early pregnancy. These tests are very accurate. Wait until at least the first day after you miss your period to take one. If the test shows that you are pregnant (you get a positive result), call your health care provider to make an appointment for prenatal care. What should I do if I become pregnant?  Make an appointment with your health care provider as soon as you suspect you are pregnant.  Do not use any products that contain nicotine, such as cigarettes, chewing tobacco, and e-cigarettes. If you need help quitting, ask your health care provider.  Do not drink alcoholic beverages. Alcohol is related to a number of birth defects.  Avoid toxic odors and chemicals.  You may continue to have sexual intercourse if it does not cause pain or other problems, such as vaginal bleeding. This information is not intended to replace advice given to you by your health care provider. Make sure you discuss any questions you have with your health care provider. Document Released: 07/01/2008 Document Revised: 03/16/2016 Document Reviewed: 02/08/2016 Elsevier Interactive Patient Education  2018 Elsevier Inc.  

## 2018-05-24 LAB — RENAL FUNCTION PANEL
ALBUMIN MSPROF: 4.7 g/dL (ref 3.6–5.1)
BUN: 10 mg/dL (ref 7–25)
CALCIUM: 9.5 mg/dL (ref 8.6–10.2)
CO2: 26 mmol/L (ref 20–32)
CREATININE: 0.6 mg/dL (ref 0.50–1.10)
Chloride: 106 mmol/L (ref 98–110)
GLUCOSE: 89 mg/dL (ref 65–99)
PHOSPHORUS: 3.4 mg/dL (ref 2.5–4.5)
Potassium: 4 mmol/L (ref 3.5–5.3)
Sodium: 140 mmol/L (ref 135–146)

## 2018-05-24 LAB — HEPATIC FUNCTION PANEL
AG Ratio: 2.4 (calc) (ref 1.0–2.5)
ALBUMIN MSPROF: 4.7 g/dL (ref 3.6–5.1)
ALT: 19 U/L (ref 6–29)
AST: 17 U/L (ref 10–30)
Alkaline phosphatase (APISO): 46 U/L (ref 33–115)
BILIRUBIN DIRECT: 0.1 mg/dL (ref 0.0–0.2)
GLOBULIN: 2 g/dL (ref 1.9–3.7)
Indirect Bilirubin: 0.7 mg/dL (calc) (ref 0.2–1.2)
TOTAL PROTEIN: 6.7 g/dL (ref 6.1–8.1)
Total Bilirubin: 0.8 mg/dL (ref 0.2–1.2)

## 2018-06-01 ENCOUNTER — Encounter

## 2018-06-12 ENCOUNTER — Telehealth: Payer: Self-pay | Admitting: *Deleted

## 2018-06-12 MED ORDER — DOXYLAMINE-PYRIDOXINE 10-10 MG PO TBEC: DELAYED_RELEASE_TABLET | ORAL | 1 refills | Status: DC

## 2018-06-12 MED FILL — DOXYLAMINE-PYRIDOXINE 10-10: 10-10 | 30 days supply | Qty: 60 | Fill #0

## 2018-06-12 NOTE — Telephone Encounter (Signed)
Pt called stating that she is early pregnant and is going on a 10 day cruise and would like something to take for nuasea if it occurs on the trip.  Per protocol Diclegis was sent to pharmacy.

## 2018-06-15 ENCOUNTER — Encounter: Payer: Self-pay | Admitting: Physician Assistant

## 2018-06-25 ENCOUNTER — Other Ambulatory Visit: Payer: Self-pay | Admitting: Physician Assistant

## 2018-06-25 DIAGNOSIS — F411 Generalized anxiety disorder: Secondary | ICD-10-CM

## 2018-06-26 ENCOUNTER — Ambulatory Visit (INDEPENDENT_AMBULATORY_CARE_PROVIDER_SITE_OTHER): Payer: No Typology Code available for payment source | Admitting: Family Medicine

## 2018-06-26 DIAGNOSIS — O209 Hemorrhage in early pregnancy, unspecified: Secondary | ICD-10-CM

## 2018-06-26 MED ORDER — PROMETHAZINE HCL 25 MG PO TABS: 25.0000 mg | ORAL_TABLET | Freq: Four times a day (QID) | ORAL | 2 refills | Status: DC | PRN

## 2018-06-26 MED ORDER — FLUOXETINE HCL 20 MG PO TABS: 20.0000 mg | ORAL_TABLET | Freq: Every day | ORAL | 1 refills | Status: DC

## 2018-06-26 MED FILL — PROMETHAZINE 25 MG TABLET: 25 | 7 days supply | Qty: 30 | Fill #0

## 2018-06-26 MED FILL — FLUoxetine HCL 20 MG CAPS: 20 | 90 days supply | Qty: 90 | Fill #0

## 2018-06-26 NOTE — Telephone Encounter (Signed)
Requesting RF for Fluoxetine- recently became pregnant. Was on taper up to 2 tab QD   Please OK RF, it has been pended  Thanks!

## 2018-06-26 NOTE — Progress Notes (Signed)
Wt 167lb  LMP 05/06/18 BP 122/77p- 103

## 2018-06-26 NOTE — Progress Notes (Signed)
   Subjective:    Patient ID: Brittany Carter, female    DOB: 01-May-1991, 27 y.o.   MRN: [id number]  HPI Patient seen for vaginal bleeding in first trimester. Was seen at ED in [place] - Her cervix was open with clot or POC in os. US was done there, also showing GS and possible pregnancy. Quant was 16k. No bleeding since then. Having some nausea. No vomiting.   Review of Systems     Objective:   Physical Exam  Constitutional: She is oriented to person, place, and time. She appears well-developed and well-nourished.  Pulmonary/Chest: Effort normal.  Abdominal: She exhibits no distension. There is no tenderness.  Neurological: She is alert and oriented to person, place, and time.  Skin: Skin is warm and dry. Capillary refill takes less than 2 seconds.      Assessment & Plan:  1. Vaginal bleeding in pregnancy, first trimester Viable pregnancy. Discussed possible miscarriage. Phenergan for nausea. Follow up in 4 weeks.

## 2018-06-27 ENCOUNTER — Other Ambulatory Visit: Payer: No Typology Code available for payment source | Admitting: *Deleted

## 2018-06-27 ENCOUNTER — Encounter: Payer: Self-pay | Admitting: *Deleted

## 2018-06-27 DIAGNOSIS — O418X10 Other specified disorders of amniotic fluid and membranes, first trimester, not applicable or unspecified: Secondary | ICD-10-CM

## 2018-06-27 DIAGNOSIS — O418X20 Other specified disorders of amniotic fluid and membranes, second trimester, not applicable or unspecified: Secondary | ICD-10-CM | POA: Insufficient documentation

## 2018-06-27 DIAGNOSIS — O468X2 Other antepartum hemorrhage, second trimester: Secondary | ICD-10-CM

## 2018-06-27 DIAGNOSIS — O468X1 Other antepartum hemorrhage, first trimester: Principal | ICD-10-CM

## 2018-06-27 NOTE — Progress Notes (Signed)
Pt here stating that she had more red spotting to bleeding last night and this morning.  She denies any cramping or clotting.  Bedside U/S shows viable IUP with FHT of 126 BPM and CRL measeures 9.642mm and GA [redacted]wks.  There is a noted subchorionic hemorrhage noted.  Misty StanleyLisa Leftwich-Kirby check pt's cervix via speculum and noted that cervix was closed and brownish spotting noted.  Pt instructed to avoid intercourse and no lifting over 10 lbs until further evaluation.  Her NOB appt has been moved up to the day after her vacation.  Pt does state that she is A Pos

## 2018-07-11 ENCOUNTER — Ambulatory Visit (INDEPENDENT_AMBULATORY_CARE_PROVIDER_SITE_OTHER): Payer: No Typology Code available for payment source | Admitting: Certified Nurse Midwife

## 2018-07-11 ENCOUNTER — Encounter: Payer: Self-pay | Admitting: Certified Nurse Midwife

## 2018-07-11 DIAGNOSIS — Z113 Encounter for screening for infections with a predominantly sexual mode of transmission: Secondary | ICD-10-CM | POA: Diagnosis not present

## 2018-07-11 DIAGNOSIS — Z3401 Encounter for supervision of normal first pregnancy, first trimester: Secondary | ICD-10-CM | POA: Diagnosis not present

## 2018-07-11 DIAGNOSIS — Z3480 Encounter for supervision of other normal pregnancy, unspecified trimester: Secondary | ICD-10-CM | POA: Insufficient documentation

## 2018-07-11 NOTE — Progress Notes (Signed)
Bedside U/S shows single IUP with FHT of 176 BPM and CRL measures 23.2 mm  Gestational age is 9weeks.  Subchorionic hemmorhage is noted and pt has had some spotting .

## 2018-07-11 NOTE — Progress Notes (Signed)
Subjective:   Brittany Carter is a 27 y.o. G1P0000 at 7571w3d by LMP being seen today for her first obstetrical visit.  Her obstetrical history is significant for none. Patient does intend to breast feed. Pregnancy history fully reviewed.  Patient reports no complaints.  HISTORY: OB History  Gravida Para Term Preterm AB Living  1 0 0 0 0 0  SAB TAB Ectopic Multiple Live Births  0 0 0 0 0    # Outcome Date GA Lbr Len/2nd Weight Sex Delivery Anes PTL Lv  1 Current            Past Medical History:  Diagnosis Date  . Bartholin cyst   . Endometriosis, suspected    dx by symptoms, no surgery  . Febrile seizure (HCC)   . UTI (lower urinary tract infection), Pyelo 2013   Past Surgical History:  Procedure Laterality Date  . SHOULDER SURGERY    . TONSILLECTOMY     Family History  Problem Relation Age of Onset  . Cancer Mother        cervical  . Depression Mother   . Epilepsy Father   . Hyperlipidemia Father   . Skin cancer Sister   . Diabetes Maternal Grandmother   . Heart disease Maternal Grandmother   . Cancer Maternal Grandmother   . Diabetes Maternal Grandfather   . Heart disease Maternal Grandfather   . Diabetes Paternal Grandmother   . Heart disease Paternal Grandmother   . Diabetes Paternal Grandfather   . Heart disease Paternal Grandfather   . Hyperlipidemia Paternal Grandfather    Social History   Tobacco Use  . Smoking status: Never Smoker  . Smokeless tobacco: Never Used  Substance Use Topics  . Alcohol use: Yes    Comment: social  . Drug use: No   Allergies  Allergen Reactions  . Wasp Venom Anaphylaxis  . Tape Rash   Current Outpatient Medications on File Prior to Visit  Medication Sig Dispense Refill  . Doxylamine-Pyridoxine 10-10 MG TBEC Take 1 @ bedtime and 1 in AM if needed for nausea in pregnancy 60 tablet 1  . EPINEPHrine (EPIPEN 2-PAK) 0.3 mg/0.3 mL IJ SOAJ injection Inject 0.3 mLs (0.3 mg total) into the muscle once as needed for up to  1 dose. 2 Device 3  . FLUoxetine (PROZAC) 20 MG tablet Take 1 tablet (20 mg total) by mouth at bedtime. 90 tablet 1  . MELATONIN PO Take by mouth.    . Prenatal Vit-Fe Fumarate-FA (PRENATAL VITAMIN PO) Take by mouth.    . promethazine (PHENERGAN) 25 MG tablet Take 1 tablet (25 mg total) by mouth every 6 (six) hours as needed for nausea or vomiting. 30 tablet 2   No current facility-administered medications on file prior to visit.     Exam   Vitals:   07/11/18 0902  BP: 109/65  Pulse: 90  Weight: 76.2 kg   Fetal Heart Rate (bpm): 176  Uterus:   deferred  Pelvic Exam: Perineum:    Vulva:    Vagina:     Cervix:    Adnexa:    Bony Pelvis:   System: General: well-developed, well-nourished female in no acute distress   Breast:  normal appearance, no masses or tenderness   Skin: normal coloration and turgor, no rashes   Neurologic: oriented, normal, negative, normal mood   Extremities: normal strength, tone, and muscle mass, ROM of all joints is normal   HEENT PERRLA, extraocular movement intact and  sclera clear, anicteric   Mouth/Teeth mucous membranes moist, pharynx normal without lesions and dental hygiene good   Neck supple and no masses   Cardiovascular: regular rate and rhythm   Respiratory:  no respiratory distress, normal breath sounds   Abdomen: soft, non-tender; bowel sounds normal; no masses,  no organomegaly     Assessment:   Pregnancy: G1P0000 Patient Active Problem List   Diagnosis Date Noted  . Encounter for supervision of normal first pregnancy in first trimester 07/11/2018  . Subchorionic hemorrhage in first trimester 06/27/2018  . 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 Hyperkalemia 01/31/2018  . Total bilirubin, elevated 01/31/2018  . BMI 27.0-27.9,adult 01/30/2018  . Tinea versicolor 01/30/2018  . Seasonal allergic rhinitis 11/21/2017  . Generalized anxiety disorder 10/26/2017  . Febrile seizure (HCC) 01/09/2016  . Anxiety state 01/09/2016  .  Endometriosis 01/09/2016     Plan:  1. Encounter for supervision of normal first pregnancy in first trimester - Obstetric panel - HIV antibody (with reflex) - CULTURE, URINE COMPREHENSIVE - GC/Chlamydia probe amp ([place])not at [facility] - [place] bedside; Future - Cystic fibrosis diagnostic study - Babyscripts Schedule Optimization  Initial labs drawn. Flu vax done at work Continue prenatal vitamins. Genetic Screening discussed, First trimester screen, Quad screen and NIPS: undecided. Ultrasound discussed; fetal anatomic survey: requested. Problem list reviewed and updated The nature of [facility] with multiple MDs and other Advanced Practice Providers was explained to patient; also emphasized that fellows, residents, and students are part of our team. Routine obstetric precautions reviewed Return in about 4 weeks (around 08/08/2018).   Donette Larry 11:34 AM 07/11/18

## 2018-07-12 LAB — GC/CHLAMYDIA PROBE AMP (~~LOC~~) NOT AT ARMC
Chlamydia: NEGATIVE
NEISSERIA GONORRHEA: NEGATIVE

## 2018-07-14 ENCOUNTER — Encounter: Payer: Self-pay | Admitting: Certified Nurse Midwife

## 2018-07-14 LAB — CULTURE, URINE COMPREHENSIVE

## 2018-07-17 ENCOUNTER — Encounter: Payer: No Typology Code available for payment source | Admitting: Obstetrics & Gynecology

## 2018-07-20 LAB — OBSTETRIC PANEL
Absolute Monocytes: 448 cells/uL (ref 200–950)
Antibody Screen: NOT DETECTED
BASOS ABS: 32 {cells}/uL (ref 0–200)
BASOS PCT: 0.5 %
EOS PCT: 1.6 %
Eosinophils Absolute: 102 cells/uL (ref 15–500)
HEMATOCRIT: 37.4 % (ref 35.0–45.0)
HEMOGLOBIN: 12.5 g/dL (ref 11.7–15.5)
Hepatitis B Surface Ag: NONREACTIVE
LYMPHS ABS: 1632 {cells}/uL (ref 850–3900)
MCH: 30.2 pg (ref 27.0–33.0)
MCHC: 33.4 g/dL (ref 32.0–36.0)
MCV: 90.3 fL (ref 80.0–100.0)
MONOS PCT: 7 %
MPV: 11.7 fL (ref 7.5–12.5)
NEUTROS ABS: 4186 {cells}/uL (ref 1500–7800)
NEUTROS PCT: 65.4 %
PLATELETS: 239 10*3/uL (ref 140–400)
RBC: 4.14 10*6/uL (ref 3.80–5.10)
RDW: 12.1 % (ref 11.0–15.0)
RPR: NONREACTIVE
RUBELLA: 4.13 {index}
Total Lymphocyte: 25.5 %
WBC: 6.4 10*3/uL (ref 3.8–10.8)

## 2018-07-20 LAB — CYSTIC FIBROSIS DIAGNOSTIC STUDY

## 2018-07-20 LAB — HIV ANTIBODY (ROUTINE TESTING W REFLEX): HIV: NONREACTIVE

## 2018-08-02 NOTE — L&D Delivery Note (Addendum)
Operative Delivery Note Pt c/o some pressure and noted to be C/C/+3.  FHR w/decels to 70's after each ctx for 60-90 seconds. Dr. Rosana Hoes asked to come for VAD. Verbal consent: obtained from patient.  Risks and benefits discussed in detail.  Risks include, but are not limited to the risks of anesthesia, bleeding, infection, damage to maternal tissues, fetal cephalhematoma.  There is also the risk of inability to effect vaginal delivery of the head, or shoulder dystocia that cannot be resolved by established maneuvers, leading to the need for emergency cesarean section. Over 2 ctx, w/1 pop off, at 3:42 PM a viable female was delivered via Vaginal, Spontaneous.  Presentation: vertex; Position: Occiput,, Anterior; After 1 minute, the cord was clamped and cut. 40 units of pitocin diluted in 1000cc LR was infused rapidly IV.  T  APGAR: 7, 9; weight 5 lb 4.1 oz (2385 g).   Placenta status: manually removed d/t cx closing up.  Pt received IV nitro per anesthesia. It was inspected and a manual sweep of uterus performed, appears to be intact. Ancef 2gm ordered IV X1.    Anesthesia:  epidural  Lacerations: Periurethral;Labial Suture Repair: 2.0 vicryl Est. Blood Loss (mL):  <500cc  Mom to postpartum.  Baby to Couplet care / Skin to Skin.  Christin Fudge 01/28/2019, 4:38 PM   I presented for repetitive decels, patient 10/100/+3 and pushing well. FHR in 60s audibly with contractions. Reviewed risks of vacuum assisted delivery including risk of cephalohematoma, subgaleal hemorrhage, failed vacuum or dystocia requiring emergency c-section, risks of prolonged hypoxia in fetus, risks of damage to maternal tissue. She verbalizes understanding of the above and is agreeable to a vacuum assisted delivery.  Kiwi suction cup applied to fetal head, 2 cm anterior to the posterior fontanelle and equidistant across the sagittal sutures. The cup was inspected to ensure to vaginal tissue free from cup. Suction increased  to 450 mm Hg and gentle traction placed on vacuum device during contraction to assist maternal expulsive forces. 3 pulls with 1 popoff over 2 contractions. Suction released with delivery of fetal head. Infant delivered easily from LOP position. Delayed cord clamping done and infant handed to waiting pedi staff who inspected infant and found no evidence of cephalohematoma or similar injury.  Cord blood obtained. Placenta did not delivery spontaneously, patient given nitrous and manual extraction performed. Placenta not intact but removed in one piece. 3VC.  Posterior vaginal wall lacerations noted, repaired with 2-0 Vicryl in usual fashion. Left labial laceration repaired with 2-0 Vicryl. Weight pending, Apgars 7/9. EBL: 250 mL.    Feliz Beam, M.D. Attending Center for [employer] farm manager)

## 2018-08-03 ENCOUNTER — Ambulatory Visit (INDEPENDENT_AMBULATORY_CARE_PROVIDER_SITE_OTHER): Payer: 59 | Admitting: Obstetrics & Gynecology

## 2018-08-03 DIAGNOSIS — Z3401 Encounter for supervision of normal first pregnancy, first trimester: Secondary | ICD-10-CM

## 2018-08-05 NOTE — Progress Notes (Signed)
   PRENATAL VISIT NOTE  Subjective:  Brittany Carter is a 28 y.o. G1P0000 at [redacted]w[redacted]d being seen today for ongoing prenatal care.  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  Patient reports some bleeding.  .   Brittany Carter. Bleeding: Small.   . Denies leaking of fluid.   The following portions of the patient's history were reviewed and updated as appropriate: allergies, current medications, past family history, past medical history, past social history, past surgical history and problem list. Problem list updated.  Objective:   Vitals:   08/03/18 1419  BP: 106/68  Pulse: 86  Weight: 166 lb (75.3 kg)    Fetal Status: Fetal Heart Rate (bpm): 147         General:  Alert, oriented and cooperative. Patient is in no acute distress.  Skin: Skin is warm and dry. No rash noted.   Cardiovascular: Normal heart rate noted  Respiratory: Normal respiratory effort, no problems with respiration noted  Abdomen: Soft, gravid, appropriate for gestational age.        Pelvic: Cervical exam deferred        Extremities: Normal range of motion.  Edema: None  Mental Status: Normal mood and affect. Normal behavior. Normal judgment and thought content.   Assessment and Plan:  Pregnancy: G1P0000 at [redacted]w[redacted]d  1. Encounter for supervision of normal first pregnancy in first trimester Pt still has some bleeding--clot passed a few days ago.  Will order [place] if bleeding present at 14 weeks.  Pt will call or send my chart message.  MFM [place] if bleeding.    2.  NIPS today and AFP at next visit.  3.  Baby Rx data flowing well  and compliant.    Preterm labor symptoms and general obstetric precautions including but not limited to vaginal bleeding, contractions, leaking of fluid and fetal movement were reviewed in detail with the patient. Please refer to After Visit Summary for other counseling recommendations.  Return in about 4 weeks (around 08/31/2018).  Future Appointments  Date Time Provider Department Center  08/30/2018  9:30 AM Rasch, Harolyn Rutherford, NP [facility]    Elsie Lincoln, MD

## 2018-08-08 ENCOUNTER — Encounter: Payer: Self-pay | Admitting: *Deleted

## 2018-08-15 ENCOUNTER — Telehealth: Payer: Self-pay | Admitting: *Deleted

## 2018-08-15 DIAGNOSIS — O4690 Antepartum hemorrhage, unspecified, unspecified trimester: Secondary | ICD-10-CM

## 2018-08-15 NOTE — Telephone Encounter (Signed)
Pt called stating that she was still having vaginal bleeding and at her last appt Dr Penne Lash said she would schedule her for a formal U/S to evaluate the bleeding.  Appt made and pt made aware through My-Chart.

## 2018-08-16 ENCOUNTER — Other Ambulatory Visit (HOSPITAL_COMMUNITY): Payer: 59

## 2018-08-17 ENCOUNTER — Encounter: Payer: Self-pay | Admitting: Obstetrics & Gynecology

## 2018-08-17 ENCOUNTER — Ambulatory Visit (HOSPITAL_COMMUNITY)
Admission: RE | Admit: 2018-08-17 | Discharge: 2018-08-17 | Disposition: A | Discharge status: Home / Self Care | Payer: 59 | Source: Ambulatory Visit | Attending: Obstetrics & Gynecology | Admitting: Obstetrics & Gynecology

## 2018-08-17 DIAGNOSIS — O4690 Antepartum hemorrhage, unspecified, unspecified trimester: Secondary | ICD-10-CM | POA: Diagnosis not present

## 2018-08-17 DIAGNOSIS — O4692 Antepartum hemorrhage, unspecified, second trimester: Secondary | ICD-10-CM | POA: Diagnosis not present

## 2018-08-17 DIAGNOSIS — Z3A14 14 weeks gestation of pregnancy: Secondary | ICD-10-CM | POA: Diagnosis not present

## 2018-08-30 ENCOUNTER — Ambulatory Visit (INDEPENDENT_AMBULATORY_CARE_PROVIDER_SITE_OTHER): Payer: 59 | Admitting: Obstetrics and Gynecology

## 2018-08-30 ENCOUNTER — Encounter: Payer: Self-pay | Admitting: *Deleted

## 2018-08-30 VITALS — BP 106/74 | HR 76 | Wt 163.0 lb

## 2018-08-30 DIAGNOSIS — Z23 Encounter for immunization: Secondary | ICD-10-CM

## 2018-08-30 DIAGNOSIS — Z3482 Encounter for supervision of other normal pregnancy, second trimester: Secondary | ICD-10-CM

## 2018-08-30 DIAGNOSIS — Z3480 Encounter for supervision of other normal pregnancy, unspecified trimester: Secondary | ICD-10-CM

## 2018-08-30 DIAGNOSIS — Z1379 Encounter for other screening for genetic and chromosomal anomalies: Secondary | ICD-10-CM

## 2018-08-30 MED ORDER — CYCLOBENZAPRINE HCL 10 MG PO TABS: 10.0000 mg | ORAL_TABLET | Freq: Three times a day (TID) | ORAL | 0 refills | Status: DC | PRN

## 2018-08-30 MED FILL — CYCLOBENZAPRINE HCL 10 MG T: 10 | 10 days supply | Qty: 30 | Fill #0

## 2018-08-30 NOTE — Progress Notes (Signed)
C/O's of headaches and round ligament pain

## 2018-08-30 NOTE — Progress Notes (Signed)
   PRENATAL VISIT NOTE  Subjective:  Brittany Carter is a 28 y.o. G1P0000 at [redacted]w[redacted]d being seen today for ongoing prenatal care.  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  Patient reports no complaints.   . Vag. Bleeding: None.  Movement: Absent. Denies leaking of fluid.   The following portions of the patient's history were reviewed and updated as appropriate: allergies, current medications, past family history, past medical history, past social history, past surgical history and problem list. Problem list updated.  Objective:   Vitals:   08/30/18 0911  BP: 106/74  Pulse: 76  Weight: 163 lb (73.9 kg)    Fetal Status: Fetal Heart Rate (bpm): 145   Movement: Absent     General:  Alert, oriented and cooperative. Patient is in no acute distress.  Skin: Skin is warm and dry. No rash noted.   Cardiovascular: Normal heart rate noted  Respiratory: Normal respiratory effort, no problems with respiration noted  Abdomen: Soft, gravid, appropriate for gestational age.  Pain/Pressure: Absent     Pelvic: Cervical exam deferred        Extremities: Normal range of motion.  Edema: None  Mental Status: Normal mood and affect. Normal behavior. Normal judgment and thought content.   Assessment and Plan:  Pregnancy: G1P0000 at [redacted]w[redacted]d  1. Supervision of other normal pregnancy, antepartum  - Alpha fetoprotein, maternal - SMN1 Copy Number Analysis - [place] MFM OB COMP + 14 WK; Future - Doing well, no bleeding no complaints  - Having tension like headaches. Fx: Flexeril as needed.    Preterm labor  symptoms and general obstetric precautions including but not limited to vaginal bleeding, contractions, leaking of fluid and fetal movement were reviewed in detail with the patient. Please refer to After Visit Summary for other counseling recommendations.  No follow-ups on file.  Future Appointments  Date Time Provider Department Center  09/22/2018  9:45 AM Donette Larry, CNM [facility]    Venia Carbon, NP

## 2018-08-31 LAB — SMN1 COPY NUMBER ANALYSIS (SMA CARRIER SCREENING)

## 2018-09-01 LAB — ALPHA FETOPROTEIN, MATERNAL
AFP MoM: 1.55
AFP, Serum: 50.8 ng/mL
Calc'd Gestational Age: 16.6 weeks
Maternal Wt: 166 [lb_av]
Risk for ONTD: 1
Twins-AFP: 1

## 2018-09-01 LAB — TIQ-AOE

## 2018-09-20 ENCOUNTER — Ambulatory Visit (HOSPITAL_COMMUNITY)
Admission: RE | Admit: 2018-09-20 | Discharge: 2018-09-20 | Disposition: A | Discharge status: Home / Self Care | Payer: 59 | Source: Ambulatory Visit | Attending: Obstetrics and Gynecology | Admitting: Obstetrics and Gynecology

## 2018-09-20 ENCOUNTER — Other Ambulatory Visit (HOSPITAL_COMMUNITY): Payer: Self-pay | Admitting: *Deleted

## 2018-09-20 DIAGNOSIS — Z363 Encounter for antenatal screening for malformations: Secondary | ICD-10-CM

## 2018-09-20 DIAGNOSIS — Z362 Encounter for other antenatal screening follow-up: Secondary | ICD-10-CM

## 2018-09-20 DIAGNOSIS — Z3A19 19 weeks gestation of pregnancy: Secondary | ICD-10-CM | POA: Diagnosis not present

## 2018-09-20 DIAGNOSIS — Z3480 Encounter for supervision of other normal pregnancy, unspecified trimester: Secondary | ICD-10-CM | POA: Diagnosis not present

## 2018-09-22 ENCOUNTER — Ambulatory Visit (INDEPENDENT_AMBULATORY_CARE_PROVIDER_SITE_OTHER): Payer: 59 | Admitting: Certified Nurse Midwife

## 2018-09-22 DIAGNOSIS — Z3482 Encounter for supervision of other normal pregnancy, second trimester: Secondary | ICD-10-CM

## 2018-09-22 DIAGNOSIS — Z3A19 19 weeks gestation of pregnancy: Secondary | ICD-10-CM

## 2018-09-22 DIAGNOSIS — Z3480 Encounter for supervision of other normal pregnancy, unspecified trimester: Secondary | ICD-10-CM

## 2018-09-22 NOTE — Progress Notes (Signed)
Subjective:  Brittany Carter is a 28 y.o. G1P0000 at [redacted]w[redacted]d being seen today for ongoing prenatal care.  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  Patient reports no complaints.   . Vag. Bleeding: None.  Movement: Present. Denies leaking of fluid.   The following portions of the patient's history were reviewed and updated as appropriate: allergies, current medications, past family history, past medical history, past social history, past surgical history and problem list. Problem list updated.  Objective:   Vitals:   09/22/18 1014  BP: 109/76  Pulse: (!) 116  Weight: 75.3 kg    Fetal Status: Fetal Heart Rate (bpm): 148 Fundal Height: 20 cm Movement: Present     General:  Alert, oriented and cooperative. Patient is in no acute distress.  Skin: Skin is warm and dry. No rash noted.   Cardiovascular: Normal heart rate noted  Respiratory: Normal respiratory effort, no problems with respiration noted  Abdomen: Soft, gravid, appropriate for gestational age. Pain/Pressure: Absent     Pelvic: Vag. Bleeding: None Vag D/C Character: Thin   Cervical exam deferred        Extremities: Normal range of motion.  Edema: None  Mental Status: Normal mood and affect. Normal behavior. Normal judgment and thought content.   Urinalysis:      Assessment and Plan:  Pregnancy: G1P0000 at [redacted]w[redacted]d  1. Supervision of other normal pregnancy, antepartum - Second trimester anticipatory guidance   Preterm labor symptoms and general obstetric precautions including but not limited to vaginal bleeding, contractions,  leaking of fluid and fetal movement were reviewed in detail with the patient. Please refer to After Visit Summary for other counseling recommendations.  Return in about 8 weeks (around 11/17/2018).   Donette Larry, CNM

## 2018-09-25 MED FILL — FLUoxetine HCL 20 MG CAPS: 20 | 90 days supply | Qty: 90 | Fill #1

## 2018-10-16 ENCOUNTER — Ambulatory Visit (HOSPITAL_COMMUNITY): Payer: 59

## 2018-10-16 ENCOUNTER — Other Ambulatory Visit: Payer: Self-pay

## 2018-10-16 ENCOUNTER — Ambulatory Visit (HOSPITAL_COMMUNITY)
Admission: RE | Admit: 2018-10-16 | Discharge: 2018-10-16 | Disposition: A | Discharge status: Home / Self Care | Payer: 59 | Source: Ambulatory Visit | Attending: Certified Nurse Midwife | Admitting: Certified Nurse Midwife

## 2018-10-16 DIAGNOSIS — Z362 Encounter for other antenatal screening follow-up: Secondary | ICD-10-CM | POA: Insufficient documentation

## 2018-10-16 DIAGNOSIS — Z3A23 23 weeks gestation of pregnancy: Secondary | ICD-10-CM

## 2018-10-17 ENCOUNTER — Other Ambulatory Visit: Payer: Self-pay | Admitting: *Deleted

## 2018-10-17 ENCOUNTER — Encounter: Payer: Self-pay | Admitting: Certified Nurse Midwife

## 2018-10-17 MED ORDER — CYCLOBENZAPRINE HCL 10 MG PO TABS: 10.0000 mg | ORAL_TABLET | Freq: Three times a day (TID) | ORAL | 0 refills | Status: DC | PRN

## 2018-10-17 MED FILL — CYCLOBENZAPRINE HCL 10 MG T: 10 | 10 days supply | Qty: 30 | Fill #0

## 2018-11-04 ENCOUNTER — Other Ambulatory Visit (HOSPITAL_COMMUNITY)
Admission: RE | Admit: 2018-11-04 | Discharge: 2018-11-04 | Disposition: A | Discharge status: Home / Self Care | Payer: 59 | Source: Ambulatory Visit | Attending: *Deleted | Admitting: *Deleted

## 2018-11-22 ENCOUNTER — Ambulatory Visit (INDEPENDENT_AMBULATORY_CARE_PROVIDER_SITE_OTHER): Payer: 59 | Admitting: Obstetrics and Gynecology

## 2018-11-22 ENCOUNTER — Other Ambulatory Visit: Payer: Self-pay

## 2018-11-22 VITALS — BP 121/81 | HR 106 | Wt 171.0 lb

## 2018-11-22 DIAGNOSIS — Z3480 Encounter for supervision of other normal pregnancy, unspecified trimester: Secondary | ICD-10-CM | POA: Diagnosis not present

## 2018-11-22 DIAGNOSIS — B360 Pityriasis versicolor: Secondary | ICD-10-CM

## 2018-11-22 DIAGNOSIS — Z3A28 28 weeks gestation of pregnancy: Secondary | ICD-10-CM

## 2018-11-22 DIAGNOSIS — Z23 Encounter for immunization: Secondary | ICD-10-CM | POA: Diagnosis not present

## 2018-11-22 MED ORDER — FLUCONAZOLE 150 MG PO TABS: 300.0000 mg | ORAL_TABLET | ORAL | 0 refills | Status: DC

## 2018-11-22 MED ORDER — TERBINAFINE HCL 1 % EX CREA: 1.0000 "application " | TOPICAL_CREAM | Freq: Two times a day (BID) | CUTANEOUS | 0 refills | Status: DC

## 2018-11-22 NOTE — Patient Instructions (Addendum)
The [facility] for [facility] at [facility] hospital previously located at [address] has moved to a new location as of April 2020:  The new address is below:  Center for [facility] [address]  [address]nd Floor [address] [place], [place] [address] [phone]  MFM [address]

## 2018-11-22 NOTE — Progress Notes (Signed)
   PRENATAL VISIT NOTE  Subjective:  Brittany Carter is a 28 y.o. G1P0000 at [redacted]w[redacted]d being seen today for ongoing prenatal care.  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  Patient reports tinea versicolor symptoms .  Contractions: Not present. Vag. Bleeding: None.  Movement: Present. Denies leaking of fluid.   The following portions of the patient's history were reviewed and updated as appropriate: allergies, current medications, past family history, past medical history, past social history, past surgical history and problem list.   Objective:   Vitals:   11/22/18 0814  BP: 121/81  Pulse: (!) 106  Weight: 171 lb (77.6 kg)    Fetal Status: Fetal Heart Rate (bpm): 157 Fundal Height: 28 cm Movement: Present     General:  Alert, oriented and cooperative. Patient is in no acute distress.  Skin: Skin is warm and dry. No rash noted.   Cardiovascular: Normal heart rate noted  Respiratory: Normal respiratory effort, no problems with respiration noted  Abdomen: Soft, gravid, appropriate for gestational age.  Pain/Pressure: Absent     Pelvic: Cervical exam deferred        Extremities: Normal range of motion.  Edema: None  Mental Status: Normal mood and affect. Normal behavior. Normal judgment and thought content.   Assessment and Plan:   1. Supervision of other normal pregnancy, antepartum  - Tdap vaccine greater than or equal to 7yo IM - 2Hr GTT w/ 1 Hr Carpenter 75 g - HIV antibody (with reflex) - CBC - RPR - [place] MFM OB FOLLOW UP; Future. Last growth US showed 26% for EFW.   2.  Tinea versicolor  Rx: Terbinafine and Diflucan.    Preterm labor symptoms and general obstetric precautions including but not limited to vaginal bleeding, contractions, leaking of fluid and fetal movement were reviewed in detail with the patient. Please refer to After Visit Summary for other counseling recommendations.   Return in about 4 weeks (around 12/20/2018) for Virtual visit- low risk .  Future Appointments  Date Time Provider Department Center  12/04/2018  3:30 PM [facility] [place] 1 [facility]  12/19/2018  9:45 AM Leftwich-Kirby, Wilmer Floor, CNM [facility]    Venia Carbon, NP

## 2018-11-23 LAB — CBC
HCT: 33.3 % — ABNORMAL LOW (ref 35.0–45.0)
Hemoglobin: 11.3 g/dL — ABNORMAL LOW (ref 11.7–15.5)
MCH: 31.1 pg (ref 27.0–33.0)
MCHC: 33.9 g/dL (ref 32.0–36.0)
MCV: 91.7 fL (ref 80.0–100.0)
MPV: 11.5 fL (ref 7.5–12.5)
Platelets: 201 10*3/uL (ref 140–400)
RBC: 3.63 10*6/uL — ABNORMAL LOW (ref 3.80–5.10)
RDW: 12.4 % (ref 11.0–15.0)
WBC: 7.5 10*3/uL (ref 3.8–10.8)

## 2018-11-23 LAB — RPR: RPR Ser Ql: NONREACTIVE

## 2018-11-23 LAB — 2HR GTT W 1 HR, CARPENTER, 75 G
Glucose, 1 Hr, Gest: 89 mg/dL (ref 65–179)
Glucose, 2 Hr, Gest: 89 mg/dL (ref 65–152)
Glucose, Fasting, Gest: 77 mg/dL (ref 65–91)

## 2018-11-23 LAB — HIV ANTIBODY (ROUTINE TESTING W REFLEX): HIV 1&2 Ab, 4th Generation: NONREACTIVE

## 2018-11-28 NOTE — Addendum Note (Signed)
Addended by: Venia Carbon I on: 11/28/2018 09:51 AM   Modules accepted: Level of Service

## 2018-11-30 MED FILL — FLUCONAZOLE 150 MG TABS: 150 | 14 days supply | Qty: 4 | Fill #0

## 2018-11-30 MED FILL — SM ATHLETE'S 1% FOOT CREAM: 1 % | 15 days supply | Qty: 30 | Fill #0

## 2018-11-30 MED FILL — EPINEPHRINE 0.3 MG AUTO-INJ: 0.3 | 30 days supply | Qty: 2 | Fill #1

## 2018-12-04 ENCOUNTER — Telehealth: Payer: Self-pay | Admitting: *Deleted

## 2018-12-04 ENCOUNTER — Other Ambulatory Visit: Payer: Self-pay

## 2018-12-04 ENCOUNTER — Ambulatory Visit (HOSPITAL_COMMUNITY)
Admission: RE | Admit: 2018-12-04 | Discharge: 2018-12-04 | Disposition: A | Discharge status: Home / Self Care | Payer: 59 | Source: Ambulatory Visit | Attending: Obstetrics and Gynecology | Admitting: Obstetrics and Gynecology

## 2018-12-04 ENCOUNTER — Encounter: Payer: Self-pay | Admitting: Certified Nurse Midwife

## 2018-12-04 DIAGNOSIS — Z3A30 30 weeks gestation of pregnancy: Secondary | ICD-10-CM

## 2018-12-04 DIAGNOSIS — Z362 Encounter for other antenatal screening follow-up: Secondary | ICD-10-CM | POA: Diagnosis not present

## 2018-12-04 DIAGNOSIS — Z3480 Encounter for supervision of other normal pregnancy, unspecified trimester: Secondary | ICD-10-CM | POA: Insufficient documentation

## 2018-12-04 MED ORDER — CYCLOBENZAPRINE HCL 10 MG PO TABS: 10.0000 mg | ORAL_TABLET | Freq: Three times a day (TID) | ORAL | 0 refills | Status: DC | PRN

## 2018-12-04 MED FILL — CYCLOBENZAPRINE HCL 10 MG T: 10 | 10 days supply | Qty: 30 | Fill #0

## 2018-12-04 NOTE — Telephone Encounter (Signed)
Pt called requesting a RF on Flexeril.  RF sent to pharmacy

## 2018-12-05 ENCOUNTER — Other Ambulatory Visit (HOSPITAL_COMMUNITY): Payer: Self-pay | Admitting: *Deleted

## 2018-12-05 ENCOUNTER — Telehealth: Payer: Self-pay | Admitting: Advanced Practice Midwife

## 2018-12-05 ENCOUNTER — Telehealth (HOSPITAL_COMMUNITY): Payer: Self-pay | Admitting: Lactation Services

## 2018-12-05 DIAGNOSIS — Z362 Encounter for other antenatal screening follow-up: Secondary | ICD-10-CM

## 2018-12-05 NOTE — Telephone Encounter (Signed)
Mom called in to ask about pump options for [employer] Employees. Discussed ordering pump through [employer] website or obtaining a Medela pump in the hospital when she delivers. Explained different models that are available to employees. 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   Pt to call with further questions/concerns as needed.

## 2018-12-05 NOTE — Telephone Encounter (Signed)
Pt called because growth  [place] yesterday, 12/04/18, indicated less than 3%tile AC measurement, with overall EFW 20%tile.  Plan from MFM is to repeat US in 3 weeks for growth. Pt has Webex visit in 2 weeks, wanted to know if we need to see her more frequently in the office or change any other plan of care.    Reviewed [place] results with pt, no dx of growth restriction as AC measurement small but other measurements larger, so overall size is small/normal.  I did change pt Webex visit in 2 weeks to an in office visit for fundal height, heart tones, reassurance.  Pt to call office with any other questions/concerns and keep scheduled follow up appts with MFM. Pt states understanding.

## 2018-12-18 ENCOUNTER — Ambulatory Visit (INDEPENDENT_AMBULATORY_CARE_PROVIDER_SITE_OTHER): Payer: 59 | Admitting: Obstetrics & Gynecology

## 2018-12-18 ENCOUNTER — Other Ambulatory Visit: Payer: Self-pay

## 2018-12-18 DIAGNOSIS — Z3483 Encounter for supervision of other normal pregnancy, third trimester: Secondary | ICD-10-CM

## 2018-12-18 DIAGNOSIS — Z3480 Encounter for supervision of other normal pregnancy, unspecified trimester: Secondary | ICD-10-CM

## 2018-12-18 DIAGNOSIS — Z3A32 32 weeks gestation of pregnancy: Secondary | ICD-10-CM

## 2018-12-18 NOTE — Progress Notes (Signed)
   PRENATAL VISIT NOTE  Subjective:  Brittany Carter is a 28 y.o. G1P0000 at [redacted]w[redacted]d being seen today for ongoing prenatal care.  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  Patient reports no complaints.  Contractions: Not present. Vag. Bleeding: None.  Movement: Present. Denies leaking of fluid.   The following portions of the patient's history were reviewed and updated as appropriate: allergies, current medications, past family history, past medical history, past social history, past surgical history and problem list.   Objective:   Vitals:   12/18/18 0924  BP: 118/80  Pulse: (!) 105  Weight: 171 lb (77.6 kg)    Fetal Status: Fetal Heart Rate (bpm): 139 Fundal Height: 30 cm Movement: Present     General:  Alert, oriented and cooperative. Patient is in no acute distress.  Skin: Skin is warm and dry. No rash noted.   Cardiovascular: Normal heart rate noted  Respiratory: Normal respiratory effort, no problems with respiration noted  Abdomen: Soft, gravid, appropriate for gestational age.  Pain/Pressure: Absent     Pelvic: Cervical exam deferred        Extremities: Normal range of motion.  Edema: None  Mental Status: Normal mood and affect. Normal behavior. Normal judgment and thought content.   Assessment and Plan:  Pregnancy: G1P0000 at [redacted]w[redacted]d 1. Supervision of other normal pregnancy, antepartum MFM [place] for growth (AC <3%) on last scan. May need to be put into testing--will wait on MFM [place].  Fundal height is 30 today .    Preterm labor symptoms and general obstetric precautions including but not limited to vaginal  bleeding, contractions, leaking of fluid and fetal movement were reviewed in detail with the patient. Please refer to After Visit Summary for other counseling recommendations.   Return in about 2 weeks (around 01/01/2019).  Patient getting f/u US next week.  If needs to go into testing, will do in person visit.  If no testing, can switch to virtual.    Future Appointments  Date Time Provider Department Center  12/27/2018 10:00 AM [facility] [place] 3 [facility]  01/01/2019  8:30 AM Penne Lash, Fredrich Romans, MD [facility]    Elsie Lincoln, MD

## 2018-12-19 ENCOUNTER — Encounter: Payer: 59 | Admitting: Advanced Practice Midwife

## 2018-12-20 ENCOUNTER — Other Ambulatory Visit: Payer: Self-pay | Admitting: Physician Assistant

## 2018-12-20 DIAGNOSIS — F411 Generalized anxiety disorder: Secondary | ICD-10-CM

## 2018-12-21 NOTE — Telephone Encounter (Signed)
Please call pt and schedule for follow up visit

## 2018-12-21 NOTE — Telephone Encounter (Signed)
Appointment has been made. No further questions at this time.  

## 2018-12-22 ENCOUNTER — Encounter: Payer: Self-pay | Admitting: Physician Assistant

## 2018-12-22 ENCOUNTER — Telehealth (INDEPENDENT_AMBULATORY_CARE_PROVIDER_SITE_OTHER): Payer: 59 | Admitting: Physician Assistant

## 2018-12-22 DIAGNOSIS — F411 Generalized anxiety disorder: Secondary | ICD-10-CM

## 2018-12-22 MED ORDER — FLUOXETINE HCL 20 MG PO TABS: 20.0000 mg | ORAL_TABLET | Freq: Every day | ORAL | 1 refills | Status: DC

## 2018-12-22 MED FILL — FLUOXETINE HCL 20 MG TABS: 20 | 90 days supply | Qty: 90 | Fill #0

## 2018-12-22 NOTE — Progress Notes (Signed)
Virtual Visit via Video Note  I connected with Brittany MartinSarah Ann Depriest on 12/22/18 at  2:40 PM EDT by a video enabled telemedicine application and verified that I am speaking with the correct person using two identifiers.   I discussed the limitations of evaluation and management by telemedicine and the availability of in person appointments. The patient expressed understanding and agreed to proceed.  History of Present Illness: HPI:                                                                Brittany Carter is a 28 y.o. female   Currently [redacted] weeks pregnant under the care of Dr. Penne LashLeggett  Anxiety is well controlled on low-dose fluoxetine 20 mg daily.  No concerns.  She does endorse multiple nighttime awakenings mainly related to pregnancy.  Sleep is restorative  [facility] 12/22/2018 01/30/2018 11/21/2017 01/09/2016  Decreased Interest 0 0 0 0  Down, Depressed, Hopeless 0 0 0 0  PHQ - 2 Score 0 0 0 0  Altered sleeping - - 1 -  Tired, decreased energy - - 0 -  Change in appetite - - 0 -  Feeling bad or failure about yourself  - - 0 -  Trouble concentrating - - 0 -  Moving slowly or fidgety/restless - - 0 -  Suicidal thoughts - - 0 -  PHQ-9 Score - - 1 -    GAD 7 : Generalized Anxiety Score 12/22/2018 11/21/2017 10/26/2017  Nervous, Anxious, on Edge 1 1 2   Control/stop worrying 0 1 2  Worry too much - different things 1 1 2   Trouble relaxing 0 1 1  Restless 0 0 0  Easily annoyed or irritable 0 1 1  Afraid - awful might happen 0 1 1  Total GAD 7 Score 2 6 9   Anxiety Difficulty Not difficult at all - -      Past Medical History:  Diagnosis Date  . Bartholin cyst   . Endometriosis, suspected    dx by symptoms, no surgery  . Febrile seizure (HCC)   . UTI (lower urinary tract infection), Pyelo 2013   Past Surgical History:  Procedure Laterality Date  . SHOULDER SURGERY    . TONSILLECTOMY     Social History   Tobacco Use  . Smoking status: Never Smoker  .  Smokeless tobacco: Never Used  Substance Use Topics  . Alcohol use: Yes    Comment: social   family history includes Cancer in her maternal grandmother and mother; Depression in her mother; Diabetes in her maternal grandfather, maternal grandmother, paternal grandfather, and paternal grandmother; Epilepsy in her father; Heart disease in her maternal grandfather, maternal grandmother, paternal grandfather, and paternal grandmother; Hyperlipidemia in her father and paternal grandfather; Skin cancer in her sister.    ROS: negative except as noted in the HPI  Medications: Current Outpatient Medications  Medication Sig Dispense Refill  . cyclobenzaprine (FLEXERIL) 10 MG tablet Take 1 tablet (10 mg total) by mouth every 8 (eight) hours as needed for muscle spasms. 30 tablet 0  . EPINEPHrine (EPIPEN 2-PAK) 0.3 mg/0.3 mL IJ SOAJ injection Inject 0.3 mLs (0.3 mg total) into the muscle once as needed for up to 1 dose. 2 Device 3  . FLUoxetine (PROZAC)  20 MG tablet Take 1 tablet (20 mg total) by mouth at bedtime. 90 tablet 1  . Prenatal Vit-Fe Fumarate-FA (PRENATAL VITAMIN PO) Take by mouth.    . terbinafine (LAMISIL) 1 % cream Apply 1 application topically 2 (two) times daily. 30 g 0  . promethazine (PHENERGAN) 25 MG tablet Take 1 tablet (25 mg total) by mouth every 6 (six) hours as needed for nausea or vomiting. (Patient not taking: Reported on 12/22/2018) 30 tablet 2   No current facility-administered medications for this visit.    Allergies  Allergen Reactions  . Wasp Venom Anaphylaxis  . Tape Rash       Objective:  BP 106/65   Temp 97.9 F (36.6 C) (Oral)   Wt 172 lb 8 oz (78.2 kg)   LMP 05/06/2018   BMI 30.56 kg/m  Gen:  alert, not ill-appearing, no distress, appropriate for age HEENT: head normocephalic without obvious abnormality, conjunctiva and cornea clear, trachea midline Pulm: Normal work of breathing, normal phonation Neuro: alert and oriented x 3 Psych: cooperative,  euthymic mood, affect mood-congruent, speech is articulate, normal rate and volume; thought processes clear and goal-directed, normal judgment, good insight    No results found for this or any previous visit (from the past 72 hour(s)). No results found.    Assessment and Plan: 28 y.o. female with   .Rhyse was seen today for medication refill.  Diagnoses and all orders for this visit:  Generalized anxiety disorder -     FLUoxetine (PROZAC) 20 MG tablet; Take 1 tablet (20 mg total) by mouth at bedtime.   Anxiety well-controlled. GAD7=2.  Refilled fluoxetine.  She is planning to continue this while breast-feeding.  I recommended that she discuss effects on milk supply with her obstetrician.  Follow Up Instructions:    I discussed the assessment and treatment plan with the patient. The patient was provided an opportunity to ask questions and all were answered. The patient agreed with the plan and demonstrated an understanding of the instructions.   The patient was advised to call back or seek an in-person evaluation if the symptoms worsen or if the condition fails to improve as anticipated.  I provided 5 minutes of non-face-to-face time during this encounter.   Carlis Stable, [place]

## 2018-12-27 ENCOUNTER — Ambulatory Visit (HOSPITAL_COMMUNITY)
Admission: RE | Admit: 2018-12-27 | Discharge: 2018-12-27 | Disposition: A | Discharge status: Home / Self Care | Payer: 59 | Source: Ambulatory Visit | Attending: Obstetrics and Gynecology | Admitting: Obstetrics and Gynecology

## 2018-12-27 ENCOUNTER — Other Ambulatory Visit: Payer: Self-pay

## 2018-12-27 DIAGNOSIS — Z3A33 33 weeks gestation of pregnancy: Secondary | ICD-10-CM

## 2018-12-27 DIAGNOSIS — Z362 Encounter for other antenatal screening follow-up: Secondary | ICD-10-CM | POA: Diagnosis not present

## 2018-12-28 ENCOUNTER — Encounter: Payer: Self-pay | Admitting: *Deleted

## 2018-12-31 DIAGNOSIS — Z3483 Encounter for supervision of other normal pregnancy, third trimester: Secondary | ICD-10-CM | POA: Diagnosis not present

## 2018-12-31 DIAGNOSIS — Z3482 Encounter for supervision of other normal pregnancy, second trimester: Secondary | ICD-10-CM | POA: Diagnosis not present

## 2019-01-01 ENCOUNTER — Ambulatory Visit (INDEPENDENT_AMBULATORY_CARE_PROVIDER_SITE_OTHER): Payer: 59 | Admitting: Obstetrics & Gynecology

## 2019-01-01 ENCOUNTER — Other Ambulatory Visit: Payer: Self-pay

## 2019-01-01 VITALS — BP 112/66 | HR 100 | Wt 173.0 lb

## 2019-01-01 DIAGNOSIS — Z3483 Encounter for supervision of other normal pregnancy, third trimester: Secondary | ICD-10-CM

## 2019-01-01 DIAGNOSIS — Z3480 Encounter for supervision of other normal pregnancy, unspecified trimester: Secondary | ICD-10-CM

## 2019-01-01 DIAGNOSIS — Z3A34 34 weeks gestation of pregnancy: Secondary | ICD-10-CM

## 2019-01-01 NOTE — Progress Notes (Signed)
TELEHEALTH OBSTETRICS PRENATAL VIRTUAL VIDEO VISIT ENCOUNTER NOTE  Provider location: Center for [facility]   I connected with Brittany Carter on 01/01/19 at  8:30 AM EDT by WebEx  Video Encounter at home and verified that I am speaking with the correct person using two identifiers.   I discussed the limitations, risks, security and privacy concerns of performing an evaluation and management service by telephone and the availability of in person appointments. I also discussed with the patient that there may be a patient responsible charge related to this service. The patient expressed understanding and agreed to proceed. Subjective:  Brittany Carter is a 28 y.o. G1P0000 at [redacted]w[redacted]d being seen today for ongoing prenatal care.  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  Patient reports no complaints.  Contractions: Not present. Vag. Bleeding: None.  Movement: Present. Denies any leaking of fluid.   The following portions of the patient's history were reviewed and updated as appropriate: allergies, current medications, past family history, past medical history, past social history, past surgical history and problem list.   Objective:   Vitals:   01/01/19 0836  BP: 112/66  Pulse: 100  Weight: 173 lb (78.5 kg)    Fetal Status:     Movement: Present     General:  Alert, oriented and cooperative. Patient is in no acute distress.  Respiratory: Normal respiratory effort, no problems with respiration noted  Mental Status: Normal mood and affect. Normal behavior. Normal judgment and thought content.  Rest of  physical exam deferred due to type of encounter  Imaging: [place] Mfm Ob Follow Up  Result Date: 12/27/2018 ----------------------------------------------------------------------  OBSTETRICS REPORT                       (Signed Final 12/27/2018 11:04 am) ---------------------------------------------------------------------- Patient Info  ID #:       [id number]                          D.O.B.:  09/12/1990 (28 yrs)  Name:       [facility] Brittany               Visit Date: 12/27/2018 10:27 am ---------------------------------------------------------------------- Performed By  Performed By:     Rennie Plowman          Secondary Phy.:   [facility]                    RDMS  Attending:        Noralee Space MD        Address:          [phone] [place], [place]  Referred By:      Victorino Dike I             Location:  [facility]                    [facility] NP                                 Fetal Care  Ref. Address:     [address] [place],                    [place] [address] ---------------------------------------------------------------------- Orders   #  Description                          Code         Ordered By   1  [place] MFM OB FOLLOW UP                  [phone]     Noralee Space  ----------------------------------------------------------------------   #  Order #                    Accession #                 Episode #   1  [id number]                  [id number]                  [id number]  ---------------------------------------------------------------------- Indications   Encounter for other antenatal screening        Z36.2   follow-up   [redacted] weeks gestation of pregnancy                Z3A.33  ---------------------------------------------------------------------- Vital Signs  Weight (lb): 172                               Height:        5'3"  BMI:         30.47  ---------------------------------------------------------------------- Fetal Evaluation  Num Of Fetuses:         1  Fetal Heart Rate(bpm):  162  Cardiac Activity:       Observed  Presentation:           Cephalic  Placenta:               Anterior  P. Cord Insertion:      Visualized, central  Amniotic Fluid  AFI FV:      Within normal limits  AFI Sum(cm)     %Tile       Largest Pocket(cm)  11.02           26          5.8  RUQ(cm)       RLQ(cm)       LUQ(cm)        LLQ(cm)  2.85          2.37          0              5.8 ---------------------------------------------------------------------- Biometry  BPD:      82.7  mm     G. Age:  33w 2d         36  %    CI:  72.11   %    70 - 86                                                          FL/HC:      20.3   %    19.4 - 21.8  HC:      309.9  mm     G. Age:  34w 4d         41  %    HC/AC:      1.11        0.96 - 1.11  AC:      279.7  mm     G. Age:  32w 0d         13  %    FL/BPD:     76.1   %    71 - 87  FL:       62.9  mm     G. Age:  32w 4d         17  %    FL/AC:      22.5   %    20 - 24  HUM:      51.8  mm     G. Age:  30w 2d        < 5  %  LV:        6.2  mm  Est. FW:    1997  gm      4 lb 6 oz     39  % ---------------------------------------------------------------------- OB History  Gravidity:    1 ---------------------------------------------------------------------- Gestational Age  LMP:           33w 4d        Date:  05/06/18                 EDD:   02/10/19  U/S Today:     33w 1d                                        EDD:   02/13/19  Best:          33w 4d     Det. By:  LMP  (05/06/18)          EDD:   02/10/19 ---------------------------------------------------------------------- Anatomy  Cranium:               Appears normal         Aortic Arch:            Previously seen  Cavum:                 Appears normal         Ductal Arch:            Previously seen  Ventricles:            Appears normal         Diaphragm:              Appears normal  Choroid  Plexus:        Appears normal         Stomach:  Appears normal, left                                                                        sided  Cerebellum:            Appears normal         Abdomen:                Previously seen  Posterior Fossa:       Appears normal         Abdominal Wall:         Previously seen  Nuchal Fold:           Not applicable (>20    Cord Vessels:           Previously seen                         wks GA)  Face:                  Orbits and profile     Kidneys:                Appear normal                         previously seen  Lips:                  Previously seen        Bladder:                Appears normal  Thoracic:              Appears normal         Spine:                  Previously seen  Heart:                 Appears normal         Upper Extremities:      Previously seen                         (4CH, axis, and                         situs)  RVOT:                  Previously seen        Lower Extremities:      Previously seen  LVOT:                  Previously seen  Other:  Open hands prev visualized. Technically difficult due to fetal position.          Female gender previously imaged. ---------------------------------------------------------------------- Cervix Uterus Adnexa  Cervix  Not visualized (advanced GA >24wks)  Left Ovary  Within normal limits.  Right Ovary  Within normal limits.  Adnexa  No abnormality visualized. ---------------------------------------------------------------------- Impression  Amniotic fluid is normal and good fetal activity is seen. Fetal  growth is appropriate for gestational age. Abdominal  circumference measurements are at greater  than the 10th  percentile.  We reassured the patient of the findings (no fetal growth  restriction). ---------------------------------------------------------------------- Recommendations  An appointment was made for her to return in 4 weeks for  fetal growth assessment.  ----------------------------------------------------------------------                  Noralee Space, MD Electronically Signed Final Report   12/27/2018 11:04 am ----------------------------------------------------------------------  [place] Mfm Ob Follow Up  Result Date: 12/04/2018 ----------------------------------------------------------------------  OBSTETRICS REPORT                       (Signed Final 12/04/2018 04:33 pm) ---------------------------------------------------------------------- Patient Info  ID #:       [id number]                          D.O.B.:  June 03, 1991 (28 yrs)  Name:       [facility] ANN Siegel               Visit Date: 12/04/2018 03:09 pm ---------------------------------------------------------------------- Performed By  Performed By:     Percell Boston          Secondary Phy.:   [facility]                    RDMS  Attending:        Noralee Space MD        Address:          [phone] [place], [place]  Referred By:      Harolyn Rutherford             Location:         [facility]                    [facility] NP                                 Fetal Care  Ref. Address:     [address] [place],                    [place] [address] ---------------------------------------------------------------------- Orders   #  Description                          Code         Ordered By   1  [place] MFM OB FOLLOW UP                  78295.62     Venia Carbon  ----------------------------------------------------------------------   #  Order #                    Accession #                 Episode #   1  [id number]  [id number]                  [id number]  ---------------------------------------------------------------------- Indications   [redacted] weeks gestation of pregnancy                Z3A.30   Encounter for other antenatal screening        Z36.2   follow-up   ---------------------------------------------------------------------- Vital Signs                                                 Height:        5'3" ---------------------------------------------------------------------- Fetal Evaluation  Num Of Fetuses:         1  Fetal Heart Rate(bpm):  144  Cardiac Activity:       Observed  Presentation:           Cephalic  Placenta:               Anterior  P. Cord Insertion:      Visualized, central  Amniotic Fluid  AFI FV:      Within normal limits  AFI Sum(cm)     %Tile       Largest Pocket(cm)  17.91           67          5.31  RUQ(cm)       RLQ(cm)       LUQ(cm)        LLQ(cm)  4.6           3.38          5.31           4.62 ---------------------------------------------------------------------- Biometry  BPD:      77.8  mm     G. Age:  31w 2d         68  %    CI:        76.08   %    70 - 86                                                          FL/HC:      19.0   %    19.2 - 21.4  HC:      282.7  mm     G. Age:  31w 0d         35  %    HC/AC:      1.21        0.99 - 1.21  AC:      233.1  mm     G. Age:  27w 4d        < 3  %    FL/BPD:     69.0   %    71 - 87  FL:       53.7  mm     G. Age:  28w 3d          4  %    FL/AC:      23.0   %    20 - 24  HUM:        46  mm  G. Age:  27w 1d        < 5  %  Est. FW:    1232  gm    2 lb 11 oz      20  % ---------------------------------------------------------------------- OB History  Gravidity:    1 ---------------------------------------------------------------------- Gestational Age  LMP:           30w 2d        Date:  05/06/18                 EDD:   02/10/19  U/S Today:     29w 4d                                        EDD:   02/15/19  Best:          30w 2d     Det. By:  LMP  (05/06/18)          EDD:   02/10/19 ---------------------------------------------------------------------- Anatomy  Cranium:               Appears normal         Aortic Arch:            Previously seen  Cavum:                 Previously seen        Ductal  Arch:            Previously seen  Ventricles:            Appears normal         Diaphragm:              Previously seen  Choroid Plexus:        Previously seen        Stomach:                Appears normal, left                                                                        sided  Cerebellum:            Previously seen        Abdomen:                Previously seen  Posterior Fossa:       Previously seen        Abdominal Wall:         Previously seen  Nuchal Fold:           Previously seen        Cord Vessels:           Previously seen  Face:                  Orbits and profile     Kidneys:                Appear normal                         previously seen  Lips:  Previously seen        Bladder:                Appears normal  Thoracic:              Appears normal         Spine:                  Previously seen  Heart:                 Previously seen        Upper Extremities:      Previously seen  RVOT:                  Previously seen        Lower Extremities:      Previously seen  LVOT:                  Previously seen  Other:  Open hands prev visualized. Technically difficult due to fetal position.          Female gender. ---------------------------------------------------------------------- Cervix Uterus Adnexa  Cervix  Not visualized (advanced GA >24wks)  Uterus  No abnormality visualized.  Left Ovary  No adnexal mass visualized.  Right Ovary  No adnexal mass visualized.  Cul De Sac  No free fluid seen.  Adnexa  No abnormality visualized. ---------------------------------------------------------------------- Impression  Patient is here for fetal growth assessment.  On ultrasound, fetal growth is appropriate for gestational age.  Abdominal circumference measurement is at less than the  10th percentile (not consistent with growth restriction).  Amniotic fluid is normal and good fetal activity is seen.  Incidentally noted antenatal testing is reassuring. Umbilical  artery Doppler study is  normal.  I reassured the patient of the findings that are NOT consistent  with growth restriction. However, I recommend a follow-up  fetal growth assessment in 3 weeks to assess interval growth. ---------------------------------------------------------------------- Recommendations  An appointment was made for her to return in 3 weeks for  fetal growth assessment. ----------------------------------------------------------------------                  Noralee Space, MD Electronically Signed Final Report   12/04/2018 04:33 pm ----------------------------------------------------------------------   Assessment and Plan:  Pregnancy: G1P0000 at [redacted]w[redacted]d  1.  AC improved at last [place].  Needs another growth in 4 weeks.  Ordered today.  Pt would like to not be induced if at all possible.  Pt understands it depends on next growth [place].  2.  Remote monitoring of BPs reviewed.  Normal BPs and transferring into chart.   Preterm labor symptoms and general obstetric precautions including but not limited to vaginal bleeding, contractions, leaking of fluid and fetal movement were reviewed in detail with the patient. I discussed the assessment and treatment plan with the patient. The patient was provided an opportunity to ask questions and all were answered. The patient agreed with the plan and demonstrated an understanding of the instructions. The patient was advised to call back or seek an in-person office evaluation/go to MAU at [facility] for any urgent or concerning symptoms. Please refer to After Visit Summary for other counseling recommendations.   I provided 10 minutes of face-to-face time during this encounter.  RTC 2 weeks for cultures.  Elsie Lincoln, MD Center for [employer], [facility]

## 2019-01-15 ENCOUNTER — Encounter: Payer: Self-pay | Admitting: Obstetrics & Gynecology

## 2019-01-16 ENCOUNTER — Other Ambulatory Visit: Payer: Self-pay

## 2019-01-16 ENCOUNTER — Ambulatory Visit (INDEPENDENT_AMBULATORY_CARE_PROVIDER_SITE_OTHER): Payer: 59 | Admitting: Advanced Practice Midwife

## 2019-01-16 VITALS — BP 114/72 | HR 119 | Wt 175.0 lb

## 2019-01-16 DIAGNOSIS — O9989 Other specified diseases and conditions complicating pregnancy, childbirth and the puerperium: Secondary | ICD-10-CM

## 2019-01-16 DIAGNOSIS — Z3A36 36 weeks gestation of pregnancy: Secondary | ICD-10-CM

## 2019-01-16 DIAGNOSIS — Z113 Encounter for screening for infections with a predominantly sexual mode of transmission: Secondary | ICD-10-CM | POA: Diagnosis not present

## 2019-01-16 DIAGNOSIS — O99891 Other specified diseases and conditions complicating pregnancy: Secondary | ICD-10-CM

## 2019-01-16 DIAGNOSIS — M549 Dorsalgia, unspecified: Secondary | ICD-10-CM

## 2019-01-16 DIAGNOSIS — Z3480 Encounter for supervision of other normal pregnancy, unspecified trimester: Secondary | ICD-10-CM

## 2019-01-16 LAB — OB RESULTS CONSOLE GBS: GBS: NEGATIVE

## 2019-01-16 MED ORDER — CYCLOBENZAPRINE HCL 10 MG PO TABS: 10.0000 mg | ORAL_TABLET | Freq: Three times a day (TID) | ORAL | 0 refills | Status: DC | PRN

## 2019-01-16 MED FILL — CYCLOBENZAPRINE HCL 10 MG T: 10 | 10 days supply | Qty: 30 | Fill #0

## 2019-01-16 NOTE — Progress Notes (Signed)
   PRENATAL VISIT NOTE  Subjective:  Brittany Carter is a 28 y.o. G1P0000 at [redacted]w[redacted]d being seen today for ongoing prenatal care.  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  Patient reports backache.  Contractions: Irritability. Vag. Bleeding: None.  Movement: Present. Denies leaking of fluid.   The following portions of the patient's history were reviewed and updated as appropriate: allergies, current medications, past family history, past medical history, past social history, past surgical history and problem list.   Objective:   Vitals:   01/16/19 1015  BP: 114/72  Pulse: (!) 119  Weight: 175 lb (79.4 kg)    Fetal Status: Fetal Heart Rate (bpm): 153   Movement: Present     General:  Alert, oriented and cooperative. Patient is in no acute distress.  Skin: Skin is warm and dry. No rash noted.   Cardiovascular: Normal heart rate noted  Respiratory: Normal respiratory effort, no problems with respiration noted  Abdomen: Soft, gravid, appropriate for gestational age.  Pain/Pressure: Absent     Pelvic: Cervical exam deferred        Extremities: Normal range of motion.  Edema: Trace  Mental Status: Normal mood and affect. Normal behavior. Normal judgment and thought content.   Assessment and Plan:  Pregnancy: G1P0000 at [redacted]w[redacted]d   1. Supervision of other normal pregnancy, antepartum --Anticipatory guidance about next visits/weeks of pregnancy given. --Reviewed safety, visitor policy, reassurance about COVID-19 for pregnancy at this time. Discussed possible changes to visits, including televisits, that may occur due to COVID-19.   The office remains open if pt needs to be seen and MAU is open 24 hours/day for OB emergencies.   - Cervicovaginal ancillary only( [place]) - Culture, beta strep (group b only)  2. 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   --Renewed Rx for Flexeril. --Rest next 4 days while off work  Term labor symptoms and general obstetric precautions including but not limited to vaginal bleeding, contractions, leaking of fluid and fetal movement were reviewed in detail with the patient. Please refer to After Visit Summary for other counseling recommendations.   Return in about 2 weeks (around 01/30/2019).  Future Appointments  Date Time Provider [place]  01/25/2019  2:15 PM [place] 4 [facility]  01/30/2019 10:35 AM [place], Kathie Dike, CNM [facility]    Fatima Blank, CNM

## 2019-01-17 LAB — CERVICOVAGINAL ANCILLARY ONLY
Chlamydia: NEGATIVE
Neisseria Gonorrhea: NEGATIVE

## 2019-01-19 LAB — CULTURE, BETA STREP (GROUP B ONLY)
MICRO NUMBER:: 575722
SPECIMEN QUALITY:: ADEQUATE

## 2019-01-24 ENCOUNTER — Encounter: Payer: Self-pay | Admitting: *Deleted

## 2019-01-25 ENCOUNTER — Ambulatory Visit (HOSPITAL_BASED_OUTPATIENT_CLINIC_OR_DEPARTMENT_OTHER): Payer: 59 | Admitting: *Deleted

## 2019-01-25 ENCOUNTER — Ambulatory Visit (HOSPITAL_BASED_OUTPATIENT_CLINIC_OR_DEPARTMENT_OTHER)
Admission: RE | Admit: 2019-01-25 | Discharge: 2019-01-25 | Disposition: A | Discharge status: Home / Self Care | Payer: 59 | Source: Ambulatory Visit | Attending: Obstetrics & Gynecology | Admitting: Obstetrics & Gynecology

## 2019-01-25 ENCOUNTER — Other Ambulatory Visit: Payer: Self-pay

## 2019-01-25 ENCOUNTER — Other Ambulatory Visit: Payer: Self-pay | Admitting: Obstetrics & Gynecology

## 2019-01-25 VITALS — BP 127/81 | HR 98

## 2019-01-25 DIAGNOSIS — Z3A37 37 weeks gestation of pregnancy: Secondary | ICD-10-CM | POA: Diagnosis not present

## 2019-01-25 DIAGNOSIS — F411 Generalized anxiety disorder: Secondary | ICD-10-CM | POA: Diagnosis not present

## 2019-01-25 DIAGNOSIS — Z3480 Encounter for supervision of other normal pregnancy, unspecified trimester: Secondary | ICD-10-CM

## 2019-01-25 DIAGNOSIS — O365930 Maternal care for other known or suspected poor fetal growth, third trimester, not applicable or unspecified: Secondary | ICD-10-CM

## 2019-01-25 DIAGNOSIS — O99344 Other mental disorders complicating childbirth: Secondary | ICD-10-CM | POA: Diagnosis not present

## 2019-01-25 DIAGNOSIS — Z1159 Encounter for screening for other viral diseases: Secondary | ICD-10-CM | POA: Diagnosis not present

## 2019-01-25 DIAGNOSIS — Z3A38 38 weeks gestation of pregnancy: Secondary | ICD-10-CM | POA: Diagnosis not present

## 2019-01-25 DIAGNOSIS — O365990 Maternal care for other known or suspected poor fetal growth, unspecified trimester, not applicable or unspecified: Secondary | ICD-10-CM | POA: Insufficient documentation

## 2019-01-25 DIAGNOSIS — Z362 Encounter for other antenatal screening follow-up: Secondary | ICD-10-CM | POA: Diagnosis not present

## 2019-01-25 NOTE — Procedures (Signed)
Brittany Carter 1990-11-17 [redacted]w[redacted]d  Fetus A Non-Stress Test Interpretation for 01/25/19  Indication: IUGR  Fetal Heart Rate A Mode: External Baseline Rate (A): 120 bpm Variability: Moderate Accelerations: 15 x 15 Decelerations: None Multiple birth?: No  Uterine Activity Mode: Palpation, Toco Contraction Frequency (min): Occas, UI Contraction Quality: Mild Resting Tone Palpated: Relaxed Resting Time: Adequate  Interpretation (Fetal Testing) Nonstress Test Interpretation: Reactive Comments: EFM tracing reviewed by Dr. Donalee Citrin

## 2019-01-26 ENCOUNTER — Encounter (HOSPITAL_COMMUNITY): Payer: Self-pay

## 2019-01-26 ENCOUNTER — Other Ambulatory Visit (HOSPITAL_COMMUNITY): Payer: Self-pay | Admitting: *Deleted

## 2019-01-26 ENCOUNTER — Encounter: Payer: Self-pay | Admitting: *Deleted

## 2019-01-26 ENCOUNTER — Other Ambulatory Visit (HOSPITAL_COMMUNITY): Payer: Self-pay

## 2019-01-26 ENCOUNTER — Other Ambulatory Visit (HOSPITAL_COMMUNITY)
Admission: RE | Admit: 2019-01-26 | Discharge: 2019-01-26 | Disposition: A | Discharge status: Home / Self Care | Payer: 59 | Source: Ambulatory Visit | Attending: Obstetrics and Gynecology | Admitting: Obstetrics and Gynecology

## 2019-01-26 ENCOUNTER — Telehealth (HOSPITAL_COMMUNITY): Payer: Self-pay

## 2019-01-26 ENCOUNTER — Telehealth: Payer: Self-pay | Admitting: *Deleted

## 2019-01-26 ENCOUNTER — Other Ambulatory Visit: Payer: Self-pay | Admitting: Obstetrics & Gynecology

## 2019-01-26 DIAGNOSIS — O365990 Maternal care for other known or suspected poor fetal growth, unspecified trimester, not applicable or unspecified: Secondary | ICD-10-CM

## 2019-01-26 DIAGNOSIS — Z3480 Encounter for supervision of other normal pregnancy, unspecified trimester: Secondary | ICD-10-CM

## 2019-01-26 DIAGNOSIS — Z1159 Encounter for screening for other viral diseases: Secondary | ICD-10-CM | POA: Diagnosis not present

## 2019-01-26 LAB — SARS CORONAVIRUS 2 (TAT 6-24 HRS): SARS Coronavirus 2: NEGATIVE

## 2019-01-26 NOTE — Telephone Encounter (Signed)
Preadmission screen  

## 2019-01-26 NOTE — Telephone Encounter (Signed)
Spoke with husband and pt yesterday about the recommendation from Dr Donalee Citrin with MFM to go ahead with IOL @ 38 weeks due to poor fetal growth.  Pt was concerned about early IOL and wasn't sure at that time.  She has since been able to see her U/S report and she and husband are OK to go ahead with IOL @ 38 weeks.  She is aware that the longer they wait the increase risk of stillbirth.  She is scheduled for IOL 01/27/19 @ 0700.  Sent message to Dr Gala Romney to place IOL orders in pt chart.

## 2019-01-26 NOTE — MAU Note (Signed)
Asymptomatic, swab collected without problem. 

## 2019-01-27 ENCOUNTER — Inpatient Hospital Stay (HOSPITAL_COMMUNITY)
Admission: AD | Admit: 2019-01-27 | Discharge: 2019-01-30 | DRG: 807 | Disposition: A | Discharge status: Home / Self Care | Payer: 59 | Attending: Obstetrics and Gynecology | Admitting: Obstetrics and Gynecology

## 2019-01-27 ENCOUNTER — Inpatient Hospital Stay (HOSPITAL_COMMUNITY): Payer: 59

## 2019-01-27 ENCOUNTER — Other Ambulatory Visit: Payer: Self-pay

## 2019-01-27 ENCOUNTER — Encounter (HOSPITAL_COMMUNITY): Payer: Self-pay | Admitting: Emergency Medicine

## 2019-01-27 DIAGNOSIS — O99344 Other mental disorders complicating childbirth: Secondary | ICD-10-CM | POA: Diagnosis present

## 2019-01-27 DIAGNOSIS — O365930 Maternal care for other known or suspected poor fetal growth, third trimester, not applicable or unspecified: Principal | ICD-10-CM | POA: Diagnosis present

## 2019-01-27 DIAGNOSIS — F411 Generalized anxiety disorder: Secondary | ICD-10-CM | POA: Diagnosis present

## 2019-01-27 DIAGNOSIS — Z3A38 38 weeks gestation of pregnancy: Secondary | ICD-10-CM

## 2019-01-27 DIAGNOSIS — Z1159 Encounter for screening for other viral diseases: Secondary | ICD-10-CM

## 2019-01-27 DIAGNOSIS — O418X20 Other specified disorders of amniotic fluid and membranes, second trimester, not applicable or unspecified: Secondary | ICD-10-CM | POA: Diagnosis present

## 2019-01-27 DIAGNOSIS — O468X2 Other antepartum hemorrhage, second trimester: Secondary | ICD-10-CM | POA: Diagnosis present

## 2019-01-27 LAB — CBC
HCT: 31.8 % — ABNORMAL LOW (ref 36.0–46.0)
Hemoglobin: 10.8 g/dL — ABNORMAL LOW (ref 12.0–15.0)
MCH: 31.4 pg (ref 26.0–34.0)
MCHC: 34 g/dL (ref 30.0–36.0)
MCV: 92.4 fL (ref 80.0–100.0)
Platelets: 176 10*3/uL (ref 150–400)
RBC: 3.44 MIL/uL — ABNORMAL LOW (ref 3.87–5.11)
RDW: 12.2 % (ref 11.5–15.5)
WBC: 8.2 10*3/uL (ref 4.0–10.5)
nRBC: 0 % (ref 0.0–0.2)

## 2019-01-27 LAB — TYPE AND SCREEN
ABO/RH(D): A POS
Antibody Screen: NEGATIVE

## 2019-01-27 LAB — ABO/RH: ABO/RH(D): A POS

## 2019-01-27 MED ORDER — OXYCODONE-ACETAMINOPHEN 5-325 MG PO TABS: 2.0000 | ORAL_TABLET | ORAL | Status: DC | PRN

## 2019-01-27 MED ORDER — ZOLPIDEM TARTRATE 5 MG PO TABS
5.0000 mg | ORAL_TABLET | Freq: Once | ORAL | Status: DC
  Filled 2019-01-27: qty 1

## 2019-01-27 MED ORDER — FLEET ENEMA 7-19 GM/118ML RE ENEM: 1.0000 | ENEMA | RECTAL | Status: DC | PRN

## 2019-01-27 MED ORDER — ZOLPIDEM TARTRATE 5 MG PO TABS
5.0000 mg | ORAL_TABLET | Freq: Every evening | ORAL | Status: DC | PRN
  Administered 2019-01-27: 5 mg via ORAL
  Filled 2019-01-27: qty 1

## 2019-01-27 MED ORDER — LIDOCAINE HCL (PF) 1 % IJ SOLN
30.0000 mL | INTRAMUSCULAR | Status: AC | PRN
  Administered 2019-01-28: 30 mL via SUBCUTANEOUS
  Filled 2019-01-27: qty 30

## 2019-01-27 MED ORDER — LACTATED RINGERS IV SOLN
INTRAVENOUS | Status: DC
  Administered 2019-01-27 – 2019-01-28 (×5): via INTRAVENOUS

## 2019-01-27 MED ORDER — MISOPROSTOL 25 MCG QUARTER TABLET
ORAL_TABLET | ORAL | Status: AC
  Administered 2019-01-27: 25 ug via VAGINAL
  Filled 2019-01-27: qty 1

## 2019-01-27 MED ORDER — ACETAMINOPHEN 325 MG PO TABS
650.0000 mg | ORAL_TABLET | ORAL | Status: DC | PRN
  Administered 2019-01-27: 650 mg via ORAL
  Filled 2019-01-27: qty 2

## 2019-01-27 MED ORDER — MISOPROSTOL 25 MCG QUARTER TABLET
25.0000 ug | ORAL_TABLET | ORAL | Status: DC | PRN
  Administered 2019-01-27 (×3): 25 ug via VAGINAL
  Filled 2019-01-27 (×2): qty 1

## 2019-01-27 MED ORDER — OXYTOCIN BOLUS FROM INFUSION: 500.0000 mL | Freq: Once | INTRAVENOUS | Status: DC

## 2019-01-27 MED ORDER — OXYCODONE-ACETAMINOPHEN 5-325 MG PO TABS: 1.0000 | ORAL_TABLET | ORAL | Status: DC | PRN

## 2019-01-27 MED ORDER — SOD CITRATE-CITRIC ACID 500-334 MG/5ML PO SOLN: 30.0000 mL | ORAL | Status: DC | PRN

## 2019-01-27 MED ORDER — ONDANSETRON HCL 4 MG/2ML IJ SOLN
4.0000 mg | Freq: Four times a day (QID) | INTRAMUSCULAR | Status: DC | PRN
  Administered 2019-01-28: 4 mg via INTRAVENOUS
  Filled 2019-01-27: qty 2

## 2019-01-27 MED ORDER — TERBUTALINE SULFATE 1 MG/ML IJ SOLN: 0.2500 mg | Freq: Once | INTRAMUSCULAR | Status: DC | PRN

## 2019-01-27 MED ORDER — TERBUTALINE SULFATE 1 MG/ML IJ SOLN
0.2500 mg | Freq: Once | INTRAMUSCULAR | Status: AC | PRN
  Administered 2019-01-28: 0.25 mg via SUBCUTANEOUS
  Filled 2019-01-27: qty 1

## 2019-01-27 MED ORDER — LACTATED RINGERS IV SOLN
500.0000 mL | INTRAVENOUS | Status: DC | PRN
  Administered 2019-01-28 (×4): 500 mL via INTRAVENOUS

## 2019-01-27 MED ORDER — OXYTOCIN 40 UNITS IN NORMAL SALINE INFUSION - SIMPLE MED: 2.5000 [IU]/h | INTRAVENOUS | Status: DC

## 2019-01-27 MED ORDER — MISOPROSTOL 50MCG HALF TABLET
50.0000 ug | ORAL_TABLET | ORAL | Status: DC
  Filled 2019-01-27: qty 1

## 2019-01-27 NOTE — Progress Notes (Signed)
Labor Progress Note Brittany Carter is a 28 y.o. G1P0000 at [redacted]w[redacted]d presented for IOL for FGR  S:  Patient states some contractions are worse than others  O:  BP 128/77   Pulse 90   Temp 98.7 F (37.1 C) (Oral)   Resp 18   Ht 5\' 3"  (1.6 m)   Wt 80.3 kg   LMP 05/06/2018   BMI 31.37 kg/m   Fetal Tracing:  Baseline: 120 Variability: moderate Accels: 15x15 Decels: none  Toco: 2-4   CVE: Dilation: Fingertip Effacement (%): 70 Cervical Position: Posterior Station: -1 Presentation: Vertex Exam by:: Len Blalock , CNM    A&P: 28 y.o. G1P0000 [redacted]w[redacted]d IOL for FGR #Labor: Attempted placement of foley balloon without success. Will give another dose of cytotec and attempt at next check #Pain: n/a #FWB: Cat 1 #GBS negative   Wende Mott, CNM 4:42 PM

## 2019-01-27 NOTE — Progress Notes (Addendum)
Labor Progress Note Brittany Carter is a 28 y.o. G1P0000 at [redacted]w[redacted]d presented for IOL for FGR  S:  Patient reporting menstrual cramps  O:  BP 131/76   Pulse 83   Temp 98 F (36.7 C) (Oral)   Resp 18   Ht 5\' 3"  (1.6 m)   Wt 80.3 kg   LMP 05/06/2018   BMI 31.37 kg/m   Fetal Tracing:  Baseline: 120 Variability: moderate Accels: 15x15 Decels: none  Toco: ui   CVE: closed/thick/-2   A&P: 28 y.o. G1P0000 [redacted]w[redacted]d IOL FGR #Labor: Continue cytotec #Pain: n/a #FWB: Cat 1 #GBS negative  Wende Mott, CNM

## 2019-01-27 NOTE — Progress Notes (Signed)
[facility]: Labor Progress Note  Subjective:  Patient doing well no complaints.  Objective:  BP 126/73 (BP Location: Right Arm)   Pulse 80   Temp 98.4 F (36.9 C) (Oral)   Resp 17   Ht 5\' 3"  (1.6 m)   Wt 80.3 kg   LMP 05/06/2018   BMI 31.37 kg/m  Gen: Lying in bed comfortably. NAD.  Extremities: No signs of DVT.   CE: Dilation: Fingertip Effacement (%): Thick Cervical Position: Posterior Station: -1 Presentation: Vertex Exam by:: Dr Maudie Mercury Contractions: regular q1-38minutes FH: BL 130, mod var, + a, -d.   Assessment and Plan:  Shaqueta Casady is a 28 y.o. G1P0000 at [redacted]w[redacted]d - IOL FGR  Labor: s/p cytotec x 3 with two failed FB attempts. Last attempted with speculum and could not penetrate internal os external os about 1 cm.  Cervix continues to be thick. Has received 3 vaginal Cytotec, will transition to buccal Cytotec and recheck in 4 hours.  . Pain control: IV pain meds . Anticipated MOD: NSVD   Fetal Wellbeing: Cat I tracing . GBS Negative (06/16 0000)  . Continuous fetal monitoring  Zettie Cooley, M.D.  Family Medicine  PGY-1 01/27/2019 10:52 PM

## 2019-01-27 NOTE — H&P (Signed)
OBSTETRIC ADMISSION HISTORY AND PHYSICAL  Brittany MartinSarah Ann Wysong is a 28 y.o. female G1P0000 with IUP at 2054w0d by LMP presenting for IOL for FGR. She reports +FMs, No LOF, no VB, no blurry vision, headaches or peripheral edema, and RUQ pain.  She plans on breast feeding. She request IUD for birth control. [facility]  Dating: By LMP --->  Estimated Date of Delivery: 02/10/19  Sono:    @[redacted]w[redacted]d , CWD, normal anatomy, cephalic presentation, 2390g, 3% EFW  Nursing Staff Provider  Office Location  [place] Dating   LMP  Language  English Anatomy US   normal but incomplete heart> f/u Small for gestational age, will have growth in 3 weeks.   Flu Vaccine  Received [employer] Employee Genetic Screen   NIPS:NML BOY   AFP:  neg   TDaP vaccine   11/22/18 Hgb A1C or  GTT Third trimester:  Glucose, Fasting, Gest 65 - 91 mg/dL 77    Glucose, 1 Hr, Gest 65 - 179 mg/dL 89    Glucose, 2 Hr, Gest 65 - 152 mg/dL 89     Rhogam  NA   LAB RESULTS   Feeding Plan  breast Blood Type   A pos  Contraception  IUD Antibody  neg  Circumcision  yes Rubella  Immune  Pediatrician   RPR   NR  Support Person [place] HBsAg   neg  Prenatal Classes  HIV  NR  BTL Consent  GBS  neg  VBAC Consent  Pap 7/19 NML    Hgb Electro      CF  neg    SMA  test was canceled by lab    Waterbirth  [ ]  Class [ ]  Consent [ ]  CNM visit    Prenatal History/Complications:  Past Medical History: Past Medical History:  Diagnosis Date  . Anxiety   . Bartholin cyst   . Complication of anesthesia    hard to wake up  . Endometriosis, suspected    dx by symptoms, no surgery  . Febrile seizure (HCC)   . UTI (lower urinary tract infection), Pyelo 2013    Past Surgical History: Past Surgical History:  Procedure Laterality Date  . SHOULDER SURGERY    . TONSILLECTOMY      Obstetrical History: OB History    Gravida  1   Para  0   Term  0   Preterm  0   AB  0   Living  0     SAB  0   TAB  0    Ectopic  0   Multiple  0   Live Births  0           Social History: Social History   Socioeconomic History  . Marital status: Married    Spouse name: Not on file  . Number of children: Not on file  . Years of education: Not on file  . Highest education level: Not on file  Occupational History  . Not on file  Social Needs  . Financial resource strain: Not hard at all  . Food insecurity    Worry: Never true    Inability: Never true  . Transportation needs    Medical: No    Non-medical: No  Tobacco Use  . Smoking status: Never Smoker  . Smokeless tobacco: Never Used  Substance and Sexual Activity  . Alcohol use: Yes    Comment: social  . Drug use: No  . Sexual activity: Yes  Lifestyle  . Physical activity    Days per week: Not on file    Minutes per session: Not on file  . Stress: Rather much  Relationships  . Social connections    Talks oMusiciann phone: Not on file    Gets together: Not on file    Attends religious service: Not on file    Active member of club or organization: Not on file    Attends meetings of clubs or organizations: Not on file    Relationship status: Not on file  Other Topics Concern  . Not on file  Social History Narrative  . Not on file    Family History: Family History  Problem Relation Age of Onset  . Cancer Mother        cervical  . Depression Mother   . Epilepsy Father   . Hyperlipidemia Father   . Skin cancer Sister   . Diabetes Maternal Grandmother   . Heart disease Maternal Grandmother   . Cancer Maternal Grandmother   . Diabetes Maternal Grandfather   . Heart disease Maternal Grandfather   . Diabetes Paternal Grandmother   . Heart disease Paternal Grandmother   . Diabetes Paternal Grandfather   . Heart disease Paternal Grandfather   . Hyperlipidemia Paternal Grandfather   . Heart attack Paternal Grandfather     Allergies: Allergies  Allergen Reactions  . Wasp Venom Anaphylaxis  . Tape Rash    Medications Prior  to Admission  Medication Sig Dispense Refill Last Dose  . cyclobenzaprine (FLEXERIL) 10 MG tablet Take 1 tablet (10 mg total) by mouth every 8 (eight) hours as needed for muscle spasms. 30 tablet 0   . EPINEPHrine (EPIPEN 2-PAK) 0.3 mg/0.3 mL IJ SOAJ injection Inject 0.3 mLs (0.3 mg total) into the muscle once as needed for up to 1 dose. 2 Device 3   . FLUoxetine (PROZAC) 20 MG tablet Take 1 tablet (20 mg total) by mouth at bedtime. 90 tablet 1   . Prenatal Vit-Fe Fumarate-FA (PRENATAL VITAMIN PO) Take by mouth.     . terbinafine (LAMISIL) 1 % cream Apply 1 application topically 2 (two) times daily. 30 g 0      Review of Systems   All systems reviewed and negative except as stated in HPI  Blood pressure 124/81, pulse (!) 121, temperature 98.8 F (37.1 C), temperature source Oral, resp. rate 18, height 5\' 3"  (1.6 m), weight 80.3 kg, last menstrual period 05/06/2018. General appearance: alert, cooperative and no distress Lungs: clear to auscultation bilaterally Heart: regular rate and rhythm Abdomen: soft, non-tender; bowel sounds normal Pelvic: n/a Extremities: Homans sign is negative, no sign of DVT DTR's +2 Presentation: cephalic Fetal monitoringBaseline: 120 bpm, Variability: Good {> 6 bpm), Accelerations: Reactive and Decelerations: Absent Uterine activity: occasional uc's Dilation: Closed Effacement (%): Thick Station: Ballotable Exam by:: Cleone Slimaroline Neill, CNM   Prenatal labs: ABO, Rh: --/--/A POS (06/27 0740) Antibody: NEG (06/27 0740) Rubella: 4.13 (12/10 1016) RPR: NON-REACTIVE (04/22 0851)  HBsAg: NON-REACTIVE (12/10 1016)  HIV: NON-REACTIVE (04/22 0851)  GBS: Negative (06/16 0000)   Prenatal Transfer Tool  Maternal Diabetes: No Genetic Screening: Normal Maternal Ultrasounds/Referrals: IUGR Fetal Ultrasounds or other Referrals:  None Maternal Substance Abuse:  No Significant Maternal Medications:  None Significant Maternal Lab Results: Group B Strep  negative  Results for orders placed or performed during the hospital encounter of 01/27/19 (from the past 24 hour(s))  Type and screen   Collection Time: 01/27/19  7:40 AM  Result  Value Ref Range   ABO/RH(D) A POS    Antibody Screen NEG    Sample Expiration      01/30/2019,2359 Performed at [facility], [place] [address], [place],  [address]   CBC   Collection Time: 01/27/19  7:48 AM  Result Value Ref Range   WBC 8.2 4.0 - 10.5 K/uL   RBC 3.44 (L) 3.87 - 5.11 MIL/uL   Hemoglobin 10.8 (L) 12.0 - 15.0 g/dL   HCT 31.8 (L) 36.0 - 46.0 %   MCV 92.4 80.0 - 100.0 fL   MCH 31.4 26.0 - 34.0 pg   MCHC 34.0 30.0 - 36.0 g/dL   RDW 12.2 11.5 - 15.5 %   Platelets 176 150 - 400 K/uL   nRBC 0.0 0.0 - 0.2 %  Results for orders placed or performed during the hospital encounter of 01/26/19 (from the past 24 hour(s))  SARS Coronavirus 2 (Performed in [place] hospital lab)   Collection Time: 01/26/19  2:01 PM   Specimen: Nasal Swab  Result Value Ref Range   SARS Coronavirus 2 NEGATIVE NEGATIVE    Patient Active Problem List   Diagnosis Date Noted  . SGA (small for gestational age) 01/27/2019  . Genetic screening 08/30/2018  . Supervision of other normal pregnancy, antepartum 07/11/2018  . Subchorionic hematoma in second trimester 06/27/2018  . 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 BMI 27.0-27.9,adult 01/30/2018  . Tinea versicolor 01/30/2018  . Seasonal allergic rhinitis 11/21/2017  . Generalized anxiety disorder 10/26/2017  . Febrile seizure ([place]) 01/09/2016  . Anxiety state 01/09/2016  . Endometriosis 01/09/2016    Assessment/Plan:  Brittany Carter is a 28 y.o. G1P0000 at 105w0d here for IOL for FGR.   #Labor: cytotec and plan foley when able #Pain: Per patient request, planning epidural  #FWB: Cat 1 #ID:  GBS neg #MOF: Breast #MOC: IUD #Circ:  inpatient  Wende Mott, CNM  01/27/2019, 9:01 AM

## 2019-01-28 ENCOUNTER — Inpatient Hospital Stay (HOSPITAL_COMMUNITY): Payer: 59 | Admitting: Anesthesiology

## 2019-01-28 DIAGNOSIS — O365930 Maternal care for other known or suspected poor fetal growth, third trimester, not applicable or unspecified: Secondary | ICD-10-CM

## 2019-01-28 DIAGNOSIS — Z3A38 38 weeks gestation of pregnancy: Secondary | ICD-10-CM

## 2019-01-28 MED ORDER — PRENATAL MULTIVITAMIN CH
1.0000 | ORAL_TABLET | Freq: Every day | ORAL | Status: DC
  Administered 2019-01-29 – 2019-01-30 (×2): 1 via ORAL
  Filled 2019-01-28 (×2): qty 1

## 2019-01-28 MED ORDER — WITCH HAZEL-GLYCERIN EX PADS: 1.0000 "application " | MEDICATED_PAD | CUTANEOUS | Status: DC | PRN

## 2019-01-28 MED ORDER — PHENYLEPHRINE 40 MCG/ML (10ML) SYRINGE FOR IV PUSH (FOR BLOOD PRESSURE SUPPORT): 80.0000 ug | PREFILLED_SYRINGE | INTRAVENOUS | Status: DC | PRN

## 2019-01-28 MED ORDER — METHYLERGONOVINE MALEATE 0.2 MG PO TABS: 0.2000 mg | ORAL_TABLET | ORAL | Status: DC | PRN

## 2019-01-28 MED ORDER — DIPHENHYDRAMINE HCL 25 MG PO CAPS: 25.0000 mg | ORAL_CAPSULE | Freq: Four times a day (QID) | ORAL | Status: DC | PRN

## 2019-01-28 MED ORDER — SODIUM CHLORIDE (PF) 0.9 % IJ SOLN
INTRAMUSCULAR | Status: DC | PRN
  Administered 2019-01-28: 14 mL/h via EPIDURAL

## 2019-01-28 MED ORDER — PHENYLEPHRINE HCL (PRESSORS) 10 MG/ML IV SOLN
INTRAVENOUS | Status: DC | PRN
  Administered 2019-01-28 (×2): 40 ug via INTRAVENOUS

## 2019-01-28 MED ORDER — LIDOCAINE HCL (PF) 1 % IJ SOLN
INTRAMUSCULAR | Status: DC | PRN
  Administered 2019-01-28: 11 mL via EPIDURAL

## 2019-01-28 MED ORDER — BENZOCAINE-MENTHOL 20-0.5 % EX AERO
1.0000 "application " | INHALATION_SPRAY | CUTANEOUS | Status: DC | PRN
  Administered 2019-01-28: 1 via TOPICAL
  Filled 2019-01-28: qty 56

## 2019-01-28 MED ORDER — PHENYLEPHRINE 40 MCG/ML (10ML) SYRINGE FOR IV PUSH (FOR BLOOD PRESSURE SUPPORT)
PREFILLED_SYRINGE | INTRAVENOUS | Status: AC
  Filled 2019-01-28: qty 10

## 2019-01-28 MED ORDER — EPHEDRINE 5 MG/ML INJ: 10.0000 mg | INTRAVENOUS | Status: DC | PRN

## 2019-01-28 MED ORDER — FENTANYL-BUPIVACAINE-NACL 0.5-0.125-0.9 MG/250ML-% EP SOLN: 12.0000 mL/h | EPIDURAL | Status: DC | PRN

## 2019-01-28 MED ORDER — ONDANSETRON HCL 4 MG PO TABS: 4.0000 mg | ORAL_TABLET | ORAL | Status: DC | PRN

## 2019-01-28 MED ORDER — DIPHENHYDRAMINE HCL 50 MG/ML IJ SOLN: 12.5000 mg | INTRAMUSCULAR | Status: DC | PRN

## 2019-01-28 MED ORDER — FENTANYL CITRATE (PF) 100 MCG/2ML IJ SOLN
INTRAMUSCULAR | Status: AC
  Filled 2019-01-28: qty 2

## 2019-01-28 MED ORDER — FENTANYL CITRATE (PF) 100 MCG/2ML IJ SOLN
100.0000 ug | INTRAMUSCULAR | Status: DC | PRN
  Administered 2019-01-28 (×2): 100 ug via INTRAVENOUS
  Filled 2019-01-28: qty 2

## 2019-01-28 MED ORDER — TETANUS-DIPHTH-ACELL PERTUSSIS 5-2.5-18.5 LF-MCG/0.5 IM SUSP: 0.5000 mL | Freq: Once | INTRAMUSCULAR | Status: DC

## 2019-01-28 MED ORDER — FENTANYL-BUPIVACAINE-NACL 0.5-0.125-0.9 MG/250ML-% EP SOLN
EPIDURAL | Status: AC
  Filled 2019-01-28: qty 250

## 2019-01-28 MED ORDER — NITROGLYCERIN 0.2 MG/ML ON CALL CATH LAB
INTRAVENOUS | Status: DC | PRN
  Administered 2019-01-28: 100 ug via INTRAVENOUS
  Administered 2019-01-28: 50 ug via INTRAVENOUS
  Administered 2019-01-28: 100 ug via INTRAVENOUS
  Administered 2019-01-28: 50 ug via INTRAVENOUS

## 2019-01-28 MED ORDER — IBUPROFEN 600 MG PO TABS
600.0000 mg | ORAL_TABLET | Freq: Four times a day (QID) | ORAL | Status: DC
  Administered 2019-01-28 – 2019-01-30 (×7): 600 mg via ORAL
  Filled 2019-01-28 (×8): qty 1

## 2019-01-28 MED ORDER — FERROUS SULFATE 325 (65 FE) MG PO TABS
325.0000 mg | ORAL_TABLET | Freq: Two times a day (BID) | ORAL | Status: DC
  Administered 2019-01-30: 325 mg via ORAL
  Filled 2019-01-28 (×3): qty 1

## 2019-01-28 MED ORDER — FLEET ENEMA 7-19 GM/118ML RE ENEM: 1.0000 | ENEMA | Freq: Every day | RECTAL | Status: DC | PRN

## 2019-01-28 MED ORDER — LACTATED RINGERS IV SOLN: 500.0000 mL | Freq: Once | INTRAVENOUS | Status: DC

## 2019-01-28 MED ORDER — OXYTOCIN 40 UNITS IN NORMAL SALINE INFUSION - SIMPLE MED
1.0000 m[IU]/min | INTRAVENOUS | Status: DC
  Administered 2019-01-28 (×2): 2 m[IU]/min via INTRAVENOUS
  Filled 2019-01-28: qty 1000

## 2019-01-28 MED ORDER — METHYLERGONOVINE MALEATE 0.2 MG/ML IJ SOLN: 0.2000 mg | INTRAMUSCULAR | Status: DC | PRN

## 2019-01-28 MED ORDER — ACETAMINOPHEN 325 MG PO TABS: 650.0000 mg | ORAL_TABLET | ORAL | Status: DC | PRN

## 2019-01-28 MED ORDER — DOCUSATE SODIUM 100 MG PO CAPS
100.0000 mg | ORAL_CAPSULE | Freq: Two times a day (BID) | ORAL | Status: DC
  Administered 2019-01-28 – 2019-01-30 (×4): 100 mg via ORAL
  Filled 2019-01-28 (×4): qty 1

## 2019-01-28 MED ORDER — ZOLPIDEM TARTRATE 5 MG PO TABS: 5.0000 mg | ORAL_TABLET | Freq: Every evening | ORAL | Status: DC | PRN

## 2019-01-28 MED ORDER — COCONUT OIL OIL: 1.0000 "application " | TOPICAL_OIL | Status: DC | PRN

## 2019-01-28 MED ORDER — ONDANSETRON HCL 4 MG/2ML IJ SOLN: 4.0000 mg | INTRAMUSCULAR | Status: DC | PRN

## 2019-01-28 MED ORDER — CEFAZOLIN SODIUM-DEXTROSE 2-4 GM/100ML-% IV SOLN
2.0000 g | Freq: Once | INTRAVENOUS | Status: AC
  Administered 2019-01-28: 2 g via INTRAVENOUS
  Filled 2019-01-28: qty 100

## 2019-01-28 MED ORDER — NITROGLYCERIN IN D5W 200-5 MCG/ML-% IV SOLN
INTRAVENOUS | Status: AC
  Filled 2019-01-28: qty 250

## 2019-01-28 MED ORDER — DIBUCAINE (PERIANAL) 1 % EX OINT: 1.0000 "application " | TOPICAL_OINTMENT | CUTANEOUS | Status: DC | PRN

## 2019-01-28 MED ORDER — BISACODYL 10 MG RE SUPP: 10.0000 mg | Freq: Every day | RECTAL | Status: DC | PRN

## 2019-01-28 MED ORDER — SIMETHICONE 80 MG PO CHEW: 80.0000 mg | CHEWABLE_TABLET | ORAL | Status: DC | PRN

## 2019-01-28 NOTE — Progress Notes (Signed)
OB/GYN Faculty Practice: Labor Progress Note  Subjective: Doing well. Into room for another FB attempt. Has been feeling more contractions.  Objective: BP 126/73 (BP Location: Right Arm)   Pulse 80   Temp 98.4 F (36.9 C) (Oral)   Resp 17   Ht 5\' 3"  (1.6 m)   Wt 80.3 kg   LMP 05/06/2018   BMI 31.37 kg/m  Gen: uncomfortable appearing Dilation: Fingertip Effacement (%): Thick Cervical Position: Posterior Station: -1 Presentation: Vertex Exam by:: Dr Maudie Mercury  Assessment and Plan: 28 y.o. G1P0000 [redacted]w[redacted]d here for IOL for FGR.  Labor: Induction started in the morning with cytotec. Has had two prior FB attempts. FB able to be placed without difficulty - inflated with 60cc, tolerated well.  -- pain control: IV fentanyl prior to placement -- PPH Risk: low  Fetal Well-Being: EFW 3%. Cephalic by sutures.  -- Category II - continuous fetal monitoring - recurrent variables after FB placement, improved with repositioning and fluid bolus  -- GBS negative   Brittany Braaten S. Juleen China, DO OB/GYN Fellow, Faculty Practice  4:04 AM

## 2019-01-28 NOTE — Progress Notes (Signed)
Vitals:   01/28/19 0932 01/28/19 1007  BP: (!) 104/54 (!) 112/56  Pulse: 71 79  Resp:    Temp:     FHR Cat 1.  Foley fell out an hour or so ago, cx 5-6cms per RN>  Restarted pitocin, ctx 2-4 minutes apart.  Pt walking some.  Will continue to increase pitocin until labor adequate

## 2019-01-28 NOTE — Anesthesia Procedure Notes (Signed)
Epidural Patient location during procedure: OB Start time: 01/28/2019 12:41 PM End time: 01/28/2019 12:56 PM  Staffing Anesthesiologist: Lynda Rainwater, MD Performed: anesthesiologist   Preanesthetic Checklist Completed: patient identified, site marked, surgical consent, pre-op evaluation, timeout performed, IV checked, risks and benefits discussed and monitors and equipment checked  Epidural Patient position: sitting Prep: ChloraPrep Patient monitoring: heart rate, cardiac monitor, continuous pulse ox and blood pressure Approach: midline Location: L2-L3 Injection technique: LOR saline  Needle:  Needle type: Tuohy  Needle gauge: 17 G Needle length: 9 cm Needle insertion depth: 5 cm Catheter type: closed end flexible Catheter size: 20 Guage Catheter at skin depth: 9 cm Test dose: negative  Assessment Events: blood not aspirated, injection not painful, no injection resistance, negative IV test and no paresthesia  Additional Notes Reason for block:procedure for pain

## 2019-01-28 NOTE — Progress Notes (Signed)
Pitocin wasn't hooked up to pt, so started at 2 mu/min about 1/2 hour ago. However, cx is changing, now 6-7/70/-1/posterior.  FHR Cat 1, ctx q 2-4 minutes, seem mild, pt not needing anything for pain.  AROM w/clear fluid.

## 2019-01-28 NOTE — Discharge Summary (Signed)
Postpartum Discharge Summary     Patient Name: Brittany Carter DOB: [birth year] MRN: [id number]  Date of admission: 01/27/2019 Delivering Provider: Sloan Leiter   Date of discharge: 01/30/2019  Admitting diagnosis: PREG. Intrauterine pregnancy: [redacted]w[redacted]d     Secondary diagnosis:  Active Problems:   Anxiety state   Generalized anxiety disorder   Subchorionic hematoma in second trimester   SGA (small for gestational age)   Discharge diagnosis: Term Pregnancy Delivered, VAVD                                            Postpartum procedures:none Augmentation: AROM, Pitocin, Cytotec and Foley Balloon Complications: VAVD for fetal bradycardia, manual extraction of placenta   Hospital course:  Induction of Labor With Vaginal Delivery   28 y.o. yo G1P0000 at [redacted]w[redacted]d was admitted to the hospital 01/27/2019 for induction of labor.  Indication for induction: IUGR.  Patient had an uncomplicated labor course as follows: Membrane Rupture Time/Date: 11:32 AM ,01/28/2019 . Intrapartum Procedures: Episiotomy: None [1] Lacerations:  Periurethral [8];Labial [10] . Patient had delivery of a Viable infant.  Information for the patient's newborn:  Kaysea, Raya [[id number]]  Delivery Method: Vag-Spont    01/28/2019  Details of delivery can be found in separate delivery note.  Patient had a routine postpartum course. Patient is discharged home 01/30/19.  Magnesium Sulfate recieved: No BMZ received: No  Physical exam  Vitals:   01/29/19 0820 01/29/19 1523 01/29/19 2136 01/30/19 0615  BP: (!) 117/53 111/64 112/74 107/77  Pulse: 63 66 77 62  Resp: 16 18 16 16   Temp: 98 F (36.7 C) 98.3 F (36.8 C) 99.3 F (37.4 C) 97.9 F (36.6 C)  TempSrc: Oral Oral Oral Oral  SpO2: 99%  100% 100%  Weight:      Height:       General: alert, well-appearing, NAD, sitting up breastfeeding Lochia: appropriate Uterine Fundus: firm Incision: N/A DVT Evaluation: 1+ bilateral non-pitting edema   Labs: Lab Results   Component Value Date   WBC 8.2 01/27/2019   HGB 10.8 (L) 01/27/2019   HCT 31.8 (L) 01/27/2019   MCV 92.4 01/27/2019   PLT 176 01/27/2019   CMP Latest Ref Rng & Units 05/23/2018  Glucose 65 - 99 mg/dL 89  BUN 7 - 25 mg/dL 10  Creatinine 0.50 - 1.10 mg/dL 0.60  Sodium 135 - 146 mmol/L 140  Potassium 3.5 - 5.3 mmol/L 4.0  Chloride 98 - 110 mmol/L 106  CO2 20 - 32 mmol/L 26  Calcium 8.6 - 10.2 mg/dL 9.5  Total Protein 6.1 - 8.1 g/dL 6.7  Total Bilirubin 0.2 - 1.2 mg/dL 0.8  AST 10 - 30 U/L 17  ALT 6 - 29 U/L 19    Discharge instruction: per After Visit Summary and "Baby and Me Booklet".  After visit meds:  Allergies as of 01/30/2019      Reactions   Wasp Venom Anaphylaxis   Tape Rash      Medication List    TAKE these medications   acetaminophen 325 MG tablet Commonly known as: Tylenol Take 2 tablets (650 mg total) by mouth every 4 (four) hours as needed (breakthrough pain).   cyclobenzaprine 10 MG tablet Commonly known as: FLEXERIL Take 1 tablet (10 mg total) by mouth every 8 (eight) hours as needed for muscle spasms.   docusate sodium 100 MG capsule Commonly  known as: COLACE Take 1 capsule (100 mg total) by mouth 2 (two) times daily as needed for mild constipation.   EPINEPHrine 0.3 mg/0.3 mL Soaj injection Commonly known as: EpiPen 2-Pak Inject 0.3 mLs (0.3 mg total) into the muscle once as needed for up to 1 dose.   FLUoxetine 20 MG tablet Commonly known as: PROZAC Take 1 tablet (20 mg total) by mouth at bedtime.   ibuprofen 800 MG tablet Commonly known as: ADVIL Take 1 tablet (800 mg total) by mouth 3 (three) times daily.   PRENATAL VITAMIN PO Take by mouth.   terbinafine 1 % cream Commonly known as: LAMISIL Apply 1 application topically 2 (two) times daily.       Diet: home with mother Activity: Advance as tolerated. Pelvic rest for 6 weeks.  Outpatient follow up:4-6 weeks Follow up Appt: No future appointments. Follow up Visit: Follow-up  Information    [facility] [place]. Schedule an appointment as soon as possible for a visit.   Specialty: Obstetrics and Gynecology Why: You should receive a call to schedule a postpartum follow-up visit. If you do not, please call clinic to make an appointment in 4-6 weeks.  Contact information: [address], [address] [place] [id number] [phone]        Please schedule this patient for Postpartum visit in: 6 weeks with the following provider: Any provider For C/S patients schedule nurse incision check in weeks 2 weeks: no High risk pregnancy complicated by: FGR Delivery mode:  Vacuum for bradycardia Anticipated Birth Control:  IUD PP Procedures needed: IUD  Schedule Integrated BH visit: no  Newborn Data: Live born female  Birth Weight:  2385g APGAR: 7, 9   Newborn Delivery   Birth date/time: 01/28/2019 15:42:00 Delivery type:      Baby Feeding: Breast Disposition:home with mother   01/30/2019 Tamera StandsLaurel S Tyrik Stetzer, DO

## 2019-01-28 NOTE — Progress Notes (Signed)
OB/GYN Faculty Practice: Labor Progress Note  Subjective: Into room with RN because of recurrent variable decelerations. Patient feeling contractions more but still relatively comfortable.   Objective: BP 121/75   Pulse (!) 59   Temp 98.5 F (36.9 C) (Oral)   Resp (!) 22   Ht 5\' 3"  (1.6 m)   Wt 80.3 kg   LMP 05/06/2018   BMI 31.37 kg/m  Gen: well-appearing, NAD Dilation: 1.5 Effacement (%): Thick Cervical Position: Posterior Station: -1 Presentation: Vertex Exam by:: Dr Juleen China  Assessment and Plan: 28 y.o. G1P0000 [redacted]w[redacted]d here for IOL for FGR.  Labor: FB still in place though becoming looser. Will stop pitocin (was only at 2) and give terbutaline for recurrent variables. Bolus running as well. Hopefully will then be able to restart pitocin once FB comes out. Continue to monitor VB. -- pain control: IV fentanyl, considering epidural  -- PPH Risk: low  Fetal Well-Being: EFW 3%. Cephalic by sutures.  -- Category II - continuous fetal monitoring - recurrent variables improved with bolus, reposition, terbutaline  -- GBS negative   Jaylee Lantry S. Juleen China, DO OB/GYN Fellow, Faculty Practice  7:27 AM

## 2019-01-28 NOTE — Anesthesia Preprocedure Evaluation (Signed)
Anesthesia Evaluation  Patient identified by MRN, date of birth, ID band Patient awake    Reviewed: Allergy & Precautions, NPO status , Patient's Chart, lab work & pertinent test results  Airway Mallampati: II  TM Distance: >3 FB Neck ROM: Full    Dental no notable dental hx.    Pulmonary neg pulmonary ROS,    Pulmonary exam normal breath sounds clear to auscultation       Cardiovascular negative cardio ROS Normal cardiovascular exam Rhythm:Regular Rate:Normal     Neuro/Psych negative neurological ROS  negative psych ROS   GI/Hepatic negative GI ROS, Neg liver ROS,   Endo/Other  negative endocrine ROS  Renal/GU negative Renal ROS  negative genitourinary   Musculoskeletal negative musculoskeletal ROS (+)   Abdominal (+) + obese,   Peds negative pediatric ROS (+)  Hematology negative hematology ROS (+)   Anesthesia Other Findings   Reproductive/Obstetrics (+) Pregnancy                             Anesthesia Physical Anesthesia Plan  ASA: II  Anesthesia Plan: Epidural   Post-op Pain Management:    Induction:   PONV Risk Score and Plan:   Airway Management Planned:   Additional Equipment:   Intra-op Plan:   Post-operative Plan:   Informed Consent:   Plan Discussed with:   Anesthesia Plan Comments:         Anesthesia Quick Evaluation  

## 2019-01-29 NOTE — Progress Notes (Signed)
Post Partum Day 1 Subjective: no complaints, up ad lib, voiding and tolerating PO  Objective: Blood pressure (!) 117/53, pulse 63, temperature 98 F (36.7 C), temperature source Oral, resp. rate 16, height 5\' 3"  (1.6 m), weight 80.3 kg, last menstrual period 05/06/2018, SpO2 99 %.  Physical Exam:  General: alert, cooperative and appears stated age Lochia: appropriate Uterine Fundus: firm Incision: n/a DVT Evaluation: No evidence of DVT seen on physical exam.  Recent Labs    01/27/19 0748  HGB 10.8*  HCT 31.8*    Assessment/Plan: Plan for discharge tomorrow and Circumcision prior to discharge   LOS: 2 days   Brittany Carter 01/29/2019, 11:22 AM

## 2019-01-29 NOTE — Lactation Note (Signed)
This note was copied from a baby's chart. Lactation Consultation Note Baby 10 hrs old. Hasn't really BF. Mom has very short shaft compressible nipples. Hand pump for pre-pumping, shells. Discussed may need NS. Mom has small nipples. Baby has significant recessed chin. Baby sucking on bottom lip.  Placed baby in football position, stroking lips w/nipple. Placed nipple in mouth a couple of times w/shallow latch. LC t-cup nipple into mouth. Baby has no interest in BF at this time. Baby acts as if trying to spit up several times. Hand expressed 5 ml colostrum. Attempted to spoon feed, took a few sips. LC felt that baby need suck training. W/gloved finger and curve tip syring baby suckled well on finger, took colostrum great from curve tip syring. Taught FOB and he gave baby the rest of colostrum.  Baby felt a little cool, asked mom to hold baby STS. Reported to RN of consult and f/u w/baby temp. Gave mom #20 and 24# NS. LC feels mom will need one. Asked mom to call for [facility] assistance when baby is interested in feeding.  RN to set up DEBP for mom. Mom knows to pump q3h for 15-20 min.  Mom encouraged to waken baby for feeds if hasn't cued in 3 hrs.  Mom encouraged to feed baby 8-12 times/24 hours and with feeding cues.  Newborn feeding habits, STS, I&O, cluster feeding, breast massage, supply and demand discussed. Encouraged mom to call for assistance or questions. Mom has her employee DEBP.  Lactation brochure given.   Patient Name: Brittany Carter [id number] Date: 01/29/2019 Reason for consult: Initial assessment;Infant < 6lbs;1st time breastfeeding;Early term 37-38.6wks   Maternal Data Has patient been taught Hand Expression?: Yes Does the patient have breastfeeding experience prior to this delivery?: No  Feeding Feeding Type: Breast Milk  LATCH Score Latch: Too sleepy or reluctant, no latch achieved, no sucking elicited.  Audible Swallowing: None  Type of Nipple: Everted at rest  and after stimulation(short shaft)  Comfort (Breast/Nipple): Soft / non-tender  Hold (Positioning): Full assist, staff holds infant at breast  LATCH Score: 4  Interventions Interventions: Breast feeding basics reviewed;Adjust position;Assisted with latch;Support pillows;Skin to skin;Position options;Breast massage;Expressed milk;Hand express;Pre-pump if needed;Reverse pressure;Hand pump;Breast compression;Comfort gels;Shells;DEBP  Lactation Tools Discussed/Used Tools: Pump Breast pump type: Double-Electric Breast Pump WIC Program: No Pump Review: Milk Storage Date initiated:: 01/29/19   Consult Status Consult Status: Follow-up Date: 01/29/19(in pm) Follow-up type: In-patient    Theodoro Kalata 01/29/2019, 4:47 AM

## 2019-01-29 NOTE — Lactation Note (Signed)
This note was copied from a baby's chart. Lactation Consultation Note  Patient Name: Boy Dayna Alia KGURK'Y Date: 01/29/2019 Reason for consult: 1st time breastfeeding;Primapara;Early term 37-38.6wks;Infant < 6lbs Baby is 51 hours old , called by the nurse tech due to  Mom being engorged .  [place] visited  Mom and she mentioned she 1st had fed the baby EBM in a bottle with purple nipple  And then tried to latch, baby fell asleep.  LC offered to assess her breast and not the outer aspects of both breast to be boarder line engorged and  The upper parts. 3 ice packs provided with instructions to ice for 15 -20 mins , and then to  Pump both breast.  Per mom felt comfortable with the settings on the DEBP.  LC reviewed engorgement prevention and tx .  Baby was waking back up and fussing, LC checked diaper dry, and offered to feed the baby formula , due to EBM not being  Available. Baby tool 10 ml PACE feeding and tolerated well. 7 p RN aware mom is icing and then pumping.    Maternal Data    Feeding Feeding Type: Formula Nipple Type: Extra Slow Flow  LATCH Score                   Interventions Interventions: Ice  Lactation Tools Discussed/Used Tools: Pump Breast pump type: Double-Electric Breast Pump Pump Review: (per mom understands set up)   Consult Status Consult Status: Follow-up Date: 01/30/19 Follow-up type: In-patient    [place] 01/29/2019, 7:55 PM

## 2019-01-29 NOTE — Anesthesia Postprocedure Evaluation (Signed)
Anesthesia Post Note  Patient: Brittany Carter  Procedure(s) Performed: AN AD HOC LABOR EPIDURAL     Patient location during evaluation: Mother Baby Anesthesia Type: Epidural Level of consciousness: awake and alert and oriented Pain management: satisfactory to patient Vital Signs Assessment: post-procedure vital signs reviewed and stable Respiratory status: respiratory function stable Cardiovascular status: stable Postop Assessment: no headache, no backache, epidural receding, patient able to bend at knees, no signs of nausea or vomiting and adequate PO intake Anesthetic complications: no    Last Vitals:  Vitals:   01/28/19 2305 01/29/19 0340  BP: 112/76 102/60  Pulse: 66 69  Resp: 18 18  Temp: 36.7 C 36.5 C  SpO2: 100% 100%    Last Pain:  Vitals:   01/29/19 0545  TempSrc:   PainSc: 4    Pain Goal: Patients Stated Pain Goal: 8 (01/27/19 1948)                 Katherina Mires

## 2019-01-29 NOTE — Progress Notes (Signed)
MOB was referred for history of depression/anxiety. * Referral screened out by Clinical Social Worker because none of the following criteria appear to apply: ~ History of anxiety/depression during this pregnancy, or of post-partum depression following prior delivery. ~ Diagnosis of anxiety and/or depression within last 3 years OR * MOB's symptoms currently being treated with medication and/or therapy.PEr infants H&P and MOB's chart review, MOB has active order for Prozac for anxiety.      Please contact the Clinical Social Worker if needs arise, by [facility] request, or if MOB scores greater than 9/yes to question 10 on Edinburgh Postpartum Depression Screen.      Virgie Dad Tazia Illescas, MSW, LCSW-A [facility] [place] [phone]

## 2019-01-30 ENCOUNTER — Encounter: Payer: 59 | Admitting: Advanced Practice Midwife

## 2019-01-30 ENCOUNTER — Encounter (HOSPITAL_COMMUNITY): Payer: Self-pay

## 2019-01-30 MED ORDER — DOCUSATE SODIUM 100 MG PO CAPS: 100.0000 mg | ORAL_CAPSULE | Freq: Two times a day (BID) | ORAL | 0 refills | Status: DC | PRN

## 2019-01-30 MED ORDER — ACETAMINOPHEN 325 MG PO TABS: 650.0000 mg | ORAL_TABLET | ORAL | Status: AC | PRN

## 2019-01-30 MED ORDER — IBUPROFEN 800 MG PO TABS: 800.0000 mg | ORAL_TABLET | Freq: Three times a day (TID) | ORAL | 0 refills | Status: DC

## 2019-01-30 MED FILL — IBUPROFEN 800 MG TAB: 800 | 10 days supply | Qty: 30 | Fill #0

## 2019-01-31 ENCOUNTER — Ambulatory Visit: Payer: Self-pay

## 2019-01-31 NOTE — Lactation Note (Signed)
This note was copied from a baby's chart. Lactation Consultation Note  Patient Name: Brittany Carter [id number] Date: 01/31/2019 Reason for consult: Follow-up assessment;Early term 37-38.6wks;Infant < 6lbs;1st time breastfeeding;Primapara;Infant weight loss;Hyperbilirubinemia  Visited with P83 Mom of ET infant at 41 hrs old.  Baby <6 lbs and at 7.5% weight loss.  Mom has been exclusively breastfeeding and has had supplementation of 22 cal formula by curved tip syringe (2-11 ml) 3 times only.   Mom trying to latch baby in football hold on right side.  Both breasts filling, not engorged.  Added pillow support vertically behind Mom and sat her upright.  Baby supported at breast height.  Assisted Mom in supporting breast during feeding, and using good head support.  Baby fussy and not staying latched.  Baby burped and hand expressed transitional milk from breast.  Baby noted to be unable to sustain a latch.   Talked about volume needed at each feeding.  Mom had been using a curved tip syringe to supplement.  Introduced slow flow nipple and paced bottle feeding to supplement baby with 20 ml of EBM.   Not sure if Mom has been double pumping using the Symphony pump.  Mom states she has been using hand pump some.  Mom has a Spectra pump at home. Talked about importance of regular pumping to support a full milk supply. TCB 12 this am, baby looking jaundiced. 3 voids and 2 stools last 24 hrs.  Plan- 1- Keep baby STS as much as possible 2- Breastfeed baby with cues, or awaken baby after 3 hrs as needed. 3- Supplement using paced bottle of 20-30 ml EBM+/formula (increasing as tolerated)  4- Pump both breasts 15-20 mins, adding breast massage and hand expression  5- Follow-up with OP lactation appointment (request made)  Engorgement prevention and treatment reviewed. Mom denies any questions with plan.  Interventions Interventions: Breast feeding basics reviewed;Assisted with latch;Skin to skin;Breast  massage;Hand express;Breast compression;Adjust position;Support pillows;Position options;Expressed milk;Hand pump;DEBP  Lactation Tools Discussed/Used Tools: Pump Breast pump type: Double-Electric Breast Pump;Manual   Consult Status Consult Status: Follow-up Date: 02/01/19 Follow-up type: [place] 01/31/2019, 8:46 AM

## 2019-02-01 ENCOUNTER — Ambulatory Visit (HOSPITAL_COMMUNITY): Payer: 59

## 2019-02-01 ENCOUNTER — Ambulatory Visit: Payer: Self-pay

## 2019-02-01 ENCOUNTER — Encounter (HOSPITAL_COMMUNITY): Payer: Self-pay

## 2019-02-01 NOTE — Lactation Note (Signed)
This note was copied from a baby's chart. Lactation Consultation Note  Patient Name: Brittany Carter [id number]'P Date: 02/01/2019 Reason for consult: Follow-up assessment;Early term 37-38.6wks;Infant < 6lbs(baby patient) Baby pt.  for discharge / mom was D/C yesterday  As LC entered the room Dr, Fatima Sanger seeing baby and parents/ confirmed D/C.  Per mom the baby is only receiving EBM and dad was feeding EBM from a bottle when LC was in the  Room.  Per mom the baby has been to the breast too, nipples are alittle sore and mom showed LC .  LC noted the breast to be full , not engorged, and nipples pinkish . LC provided with instructions  Comfort gels x 6 days  and mom already has shells. LC recommended alternating the 2.  Sore nipple and engorgement prevention and tx reviewed.  Mom aware of the [facility] resources at [facility] and has her DEBP - Spectra.   Maternal Data Has patient been taught Hand Expression?: Yes(per mom)  Feeding Feeding Type: Breast Milk Nipple Type: Slow - flow  LATCH Score                   Interventions Interventions: Breast feeding basics reviewed  Lactation Tools Discussed/Used Tools: Shells;Comfort gels Shell Type: Inverted Breast pump type: Double-Electric Breast Pump Pump Review: Milk Storage   Consult Status Consult Status: Complete Date: 02/01/19    Brittany Carter 02/01/2019, 9:30 AM

## 2019-02-05 LAB — RPR: RPR Ser Ql: NONREACTIVE

## 2019-02-20 ENCOUNTER — Other Ambulatory Visit: Payer: Self-pay

## 2019-02-20 ENCOUNTER — Encounter (HOSPITAL_COMMUNITY): Payer: Self-pay | Admitting: *Deleted

## 2019-02-20 ENCOUNTER — Ambulatory Visit (INDEPENDENT_AMBULATORY_CARE_PROVIDER_SITE_OTHER): Payer: 59 | Admitting: Certified Nurse Midwife

## 2019-02-20 NOTE — Progress Notes (Signed)
  Subjective: Brittany Carter is a 28 y.o. G1P1001 s/p VAVD on 6/28. Lochia Small. She reports soreness and tenderness at sight of laceration over the past 2-3 days. Unable to see if "something is wrong". Wants to make sure stitches and laceration is healing correctly.   Objective: Blood pressure 114/77, pulse (!) 101, temperature 98.4 F (36.9 C), height 5\' 6"  (1.676 m), weight 158 lb (71.7 kg), currently breastfeeding.  Physical Exam:  General: alert, cooperative and no distress Abdomen: soft, nontender,  Uterine Fundus: firm, 4 FB below umbilicus  Extremities: no edema Pelvic:laceration clean/dry/intact, tenderness at right lower perineum where sutures are present, no pulling or opening of wound noted    Physical Exam Genitourinary:       Assessment/Plan: Brittany Carter is a 28 y.o. G1P1001 s/p VAVD, 3 weeks PP   Patient reports soreness is better today.   Encouraged use of Sitz bath and perineum bottle for soreness/tenderness and to pat dry. Discussed reasons to return to office for evaluation. Educated on dissolving stitches takes up to 6 weeks to completely heal. Continue follow up appointment for postpartum care as scheduled.    Lajean Manes, CNM 02/20/2019, 1:13 PM

## 2019-02-26 DIAGNOSIS — Z3482 Encounter for supervision of other normal pregnancy, second trimester: Secondary | ICD-10-CM | POA: Diagnosis not present

## 2019-02-26 DIAGNOSIS — Z3483 Encounter for supervision of other normal pregnancy, third trimester: Secondary | ICD-10-CM | POA: Diagnosis not present

## 2019-03-16 ENCOUNTER — Telehealth: Payer: Self-pay | Admitting: *Deleted

## 2019-03-16 NOTE — Telephone Encounter (Signed)
Left patient a message to answer screening questions via [employer] [facility] prior to appointment on 03/19/2019 at 10:00am. Questions left on message.

## 2019-03-19 ENCOUNTER — Ambulatory Visit (INDEPENDENT_AMBULATORY_CARE_PROVIDER_SITE_OTHER): Payer: 59 | Admitting: Obstetrics & Gynecology

## 2019-03-19 ENCOUNTER — Other Ambulatory Visit: Payer: Self-pay

## 2019-03-19 ENCOUNTER — Encounter: Payer: Self-pay | Admitting: Obstetrics & Gynecology

## 2019-03-19 VITALS — BP 109/68 | HR 94 | Resp 16 | Ht 64.0 in | Wt 157.0 lb

## 2019-03-19 DIAGNOSIS — Z01812 Encounter for preprocedural laboratory examination: Secondary | ICD-10-CM

## 2019-03-19 DIAGNOSIS — Z8759 Personal history of other complications of pregnancy, childbirth and the puerperium: Secondary | ICD-10-CM | POA: Insufficient documentation

## 2019-03-19 DIAGNOSIS — Z30430 Encounter for insertion of intrauterine contraceptive device: Secondary | ICD-10-CM | POA: Diagnosis not present

## 2019-03-19 DIAGNOSIS — Z3202 Encounter for pregnancy test, result negative: Secondary | ICD-10-CM | POA: Diagnosis not present

## 2019-03-19 NOTE — Progress Notes (Signed)
Post Partum Exam  Brittany Carter is a 28 y.o. G79P1001 female who presents for a postpartum visit. She is 7 weeks postpartum following a vacuum assisted vaginal l delivery. I have fully reviewed the prenatal and intrapartum course. The delivery was at 32 gestational weeks and induced for FGR (weighted 5 pounds 4.1 ounces).  Pt had manual removal of placenta.  Pathology normal.   Anesthesia: epidural. Postpartum course has been unremarkable. Baby's course has been unremarkable. Baby is feeding by breast. Bleeding no bleeding. Bowel function is normal. Bladder function is normal. Patient is not sexually active. Contraception method is IUD. Postpartum depression screening:neg  The following portions of the patient's history were reviewed and updated as appropriate: allergies, current medications, past family history, past medical history, past social history, past surgical history and problem list. Last pap smear done 2019 and was Normal  Review of Systems Pertinent items noted in HPI and remainder of comprehensive ROS otherwise negative.    Objective:  currently breastfeeding.  General:  alert, cooperative and no distress   Breasts:  negative  Lungs: clear to auscultation bilaterally  Heart:  regular rate and rhythm  Abdomen: soft, non-tender; bowel sounds normal; no masses,  no organomegaly   Vulva:  normal  Vagina: normal vagina  Cervix:  no lesions and friable  Corpus: normal size, contour, position, consistency, mobility, non-tender  Adnexa:  no mass, fullness, tenderness  Rectal Exam: Not performed.        Assessment:    Nml postpartum exam.  Plan:   1. Contraception: IUD 2. Continue breast feeding; Covid precautions 3. Follow up in: 6 weeks or as needed.   IUD Procedure Note Patient identified, informed consent performed.  Discussed risks of irregular bleeding, cramping, infection, malpositioning or misplacement of the IUD outside the uterus which may require further  procedures. Time out was performed.  Urine pregnancy test negative.  Speculum placed in the vagina.  Cervix visualized.  Cleaned with Betadine x 2.  Grasped anteriorly with a single tooth tenaculum.  Uterus sounded to 8 cm.  Liletta IUD placed per manufacturer's recommendations.  Strings trimmed to 3 cm. Tenaculum was removed, good hemostasis noted.  Patient tolerated procedure well.   Patient was given post-procedure instructions and the IUD Liletta care card with expiration date.  Patient was also asked to check IUD strings periodically and follow up in 4-6 weeks for IUD check.

## 2019-03-23 MED ORDER — LEVONORGESTREL 19.5 MCG/DAY IU IUD
INTRAUTERINE_SYSTEM | Freq: Once | INTRAUTERINE | Status: AC
  Administered 2019-03-19: 10:00:00 via INTRAUTERINE

## 2019-03-23 NOTE — Addendum Note (Signed)
Addended by: Asencion Islam on: 03/23/2019 07:07 AM   Modules accepted: Orders

## 2019-03-29 DIAGNOSIS — Z3483 Encounter for supervision of other normal pregnancy, third trimester: Secondary | ICD-10-CM | POA: Diagnosis not present

## 2019-03-29 DIAGNOSIS — Z3482 Encounter for supervision of other normal pregnancy, second trimester: Secondary | ICD-10-CM | POA: Diagnosis not present

## 2019-04-05 MED FILL — FLUOXETINE HCL 20 MG TABS: 20 | 90 days supply | Qty: 90 | Fill #1

## 2019-04-30 ENCOUNTER — Other Ambulatory Visit: Payer: Self-pay

## 2019-04-30 ENCOUNTER — Encounter: Payer: Self-pay | Admitting: Obstetrics & Gynecology

## 2019-04-30 ENCOUNTER — Ambulatory Visit (INDEPENDENT_AMBULATORY_CARE_PROVIDER_SITE_OTHER): Payer: 59 | Admitting: Obstetrics & Gynecology

## 2019-04-30 VITALS — BP 100/62 | HR 91 | Resp 16 | Ht 65.0 in | Wt 161.0 lb

## 2019-04-30 DIAGNOSIS — Z3483 Encounter for supervision of other normal pregnancy, third trimester: Secondary | ICD-10-CM | POA: Diagnosis not present

## 2019-04-30 DIAGNOSIS — Z30431 Encounter for routine checking of intrauterine contraceptive device: Secondary | ICD-10-CM

## 2019-04-30 DIAGNOSIS — Z3482 Encounter for supervision of other normal pregnancy, second trimester: Secondary | ICD-10-CM | POA: Diagnosis not present

## 2019-04-30 DIAGNOSIS — Z975 Presence of (intrauterine) contraceptive device: Secondary | ICD-10-CM

## 2019-04-30 NOTE — Progress Notes (Signed)
   Subjective:    Patient ID: Brittany Carter, female    DOB: 12/02/1990, 28 y.o.   MRN: [id number]  HPI  Pt presents for IUD string check.  Denies bleeding and pain.  She and her partner can feel IUD strings and would like them trimmed.    Review of Systems  Constitutional: Negative.   Respiratory: Negative.   Cardiovascular: Negative.   Gastrointestinal: Negative.   Genitourinary: Negative.        Objective:   Physical Exam Vitals signs reviewed.  Constitutional:      General: She is not in acute distress.    Appearance: She is well-developed.  HENT:     Head: Normocephalic and atraumatic.  Eyes:     Conjunctiva/sclera: Conjunctivae normal.  Cardiovascular:     Rate and Rhythm: Normal rate.  Pulmonary:     Effort: Pulmonary effort is normal.  Abdominal:     General: Bowel sounds are normal. There is no distension.     Palpations: Abdomen is soft.     Tenderness: There is no abdominal tenderness.  Genitourinary:    General: Normal vulva.     Comments: Cervix nml Strings trimmed to 2 cm TVUS done and IUD at fundus. Skin:    General: Skin is warm and dry.  Neurological:     Mental Status: She is alert and oriented to person, place, and time.    Vitals:   04/30/19 0956  BP: 100/62  Pulse: 91  Resp: 16  Weight: 161 lb (73 kg)  Height: 5\' 5"  (1.651 m)      Assessment & Plan:  28 yo female with IUD strings bothering her Trimmed to 2 cm RTC 1 year for annual exam.

## 2019-05-31 DIAGNOSIS — Z3483 Encounter for supervision of other normal pregnancy, third trimester: Secondary | ICD-10-CM | POA: Diagnosis not present

## 2019-05-31 DIAGNOSIS — Z3482 Encounter for supervision of other normal pregnancy, second trimester: Secondary | ICD-10-CM | POA: Diagnosis not present

## 2019-07-10 DIAGNOSIS — Z3483 Encounter for supervision of other normal pregnancy, third trimester: Secondary | ICD-10-CM | POA: Diagnosis not present

## 2019-07-10 DIAGNOSIS — Z3482 Encounter for supervision of other normal pregnancy, second trimester: Secondary | ICD-10-CM | POA: Diagnosis not present

## 2019-10-19 IMAGING — US US MFM OB LIMITED
1 series · 15 of 28 positions shown · non-contrast
Comparison: none

[Series 1: us mfm ob limited · 42 acquisitions, 15 frames shown]
[im 1/42]
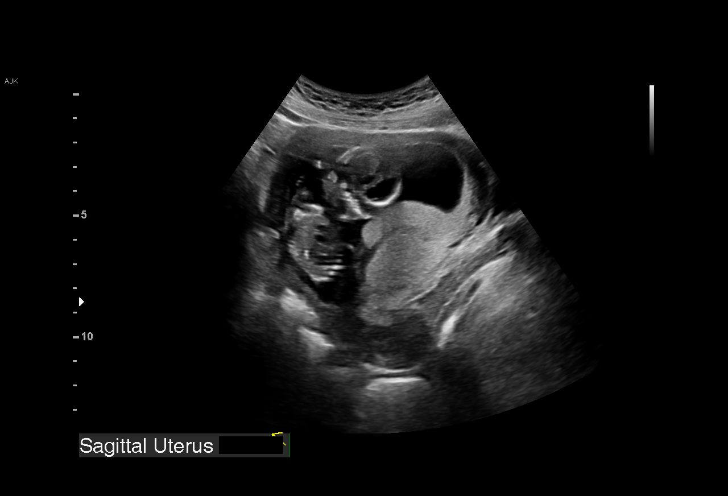
[im 4/42]
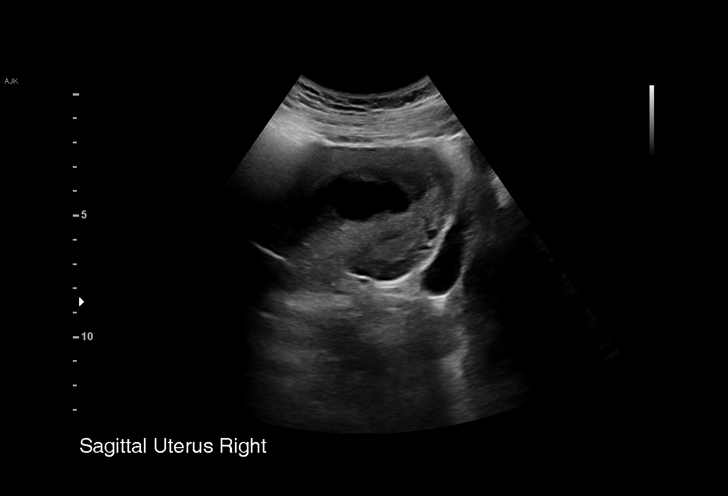
[im 7/42]
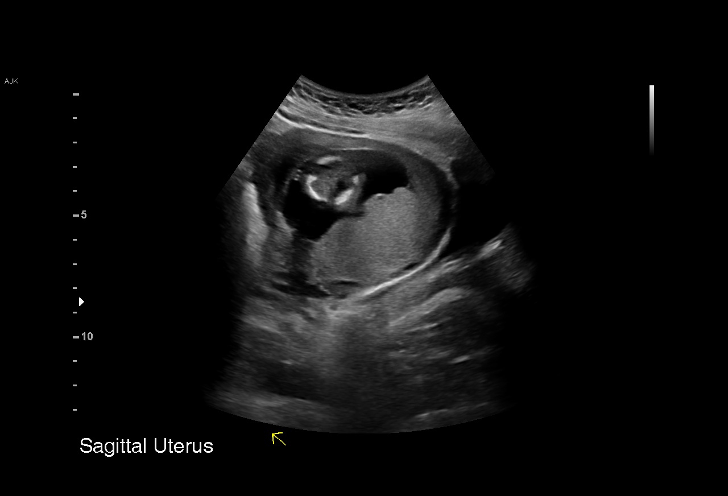
[im 10/42]
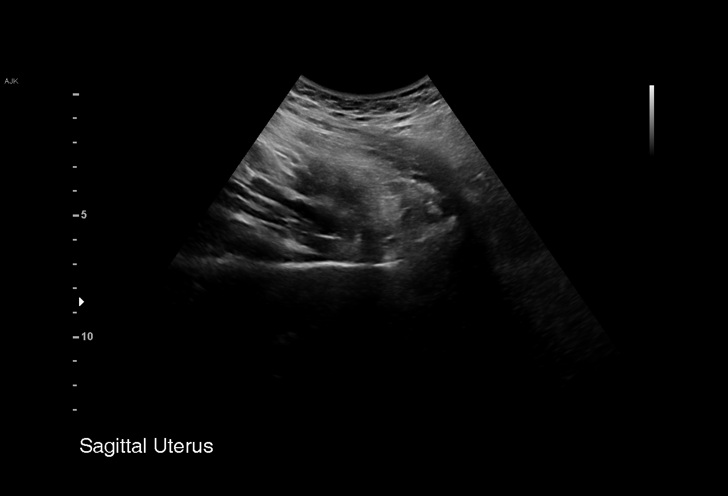
[im 13/42]
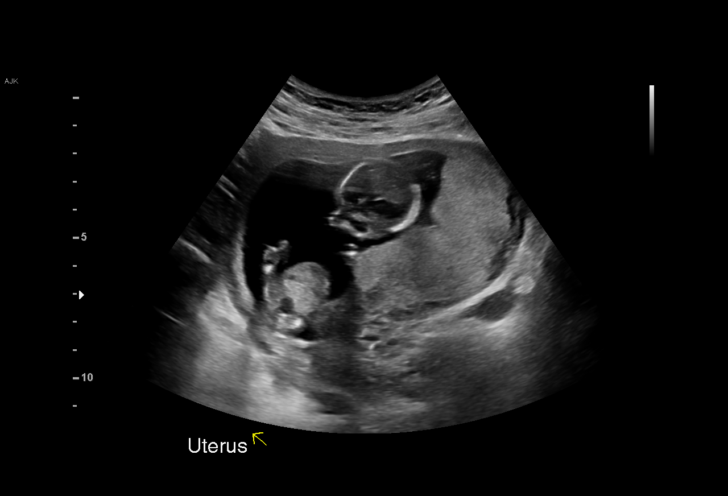
[im 16/42]
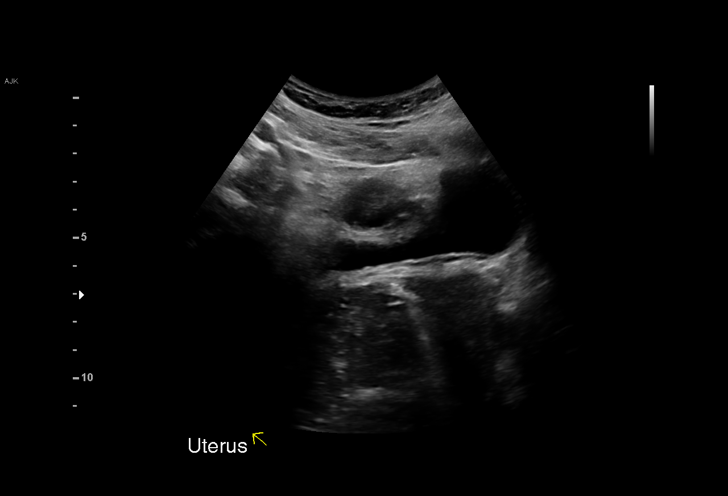
[im 19/42]
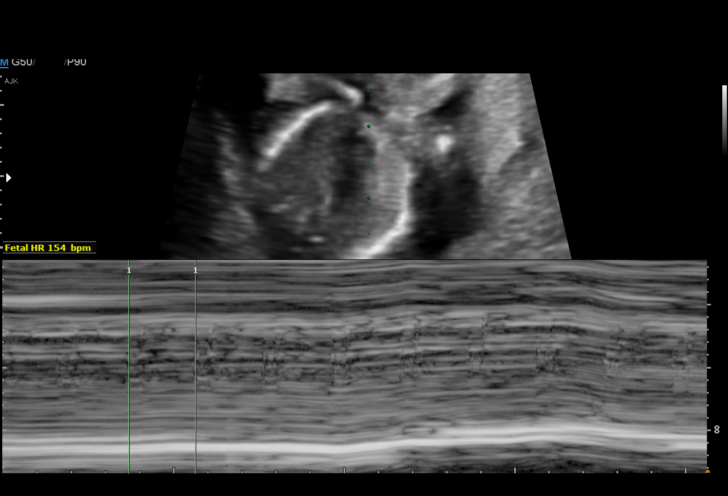
[im 22/42]
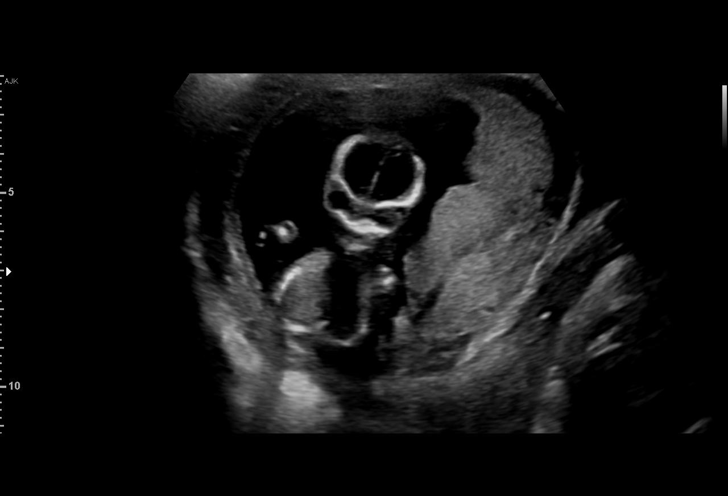
[im 23/42]
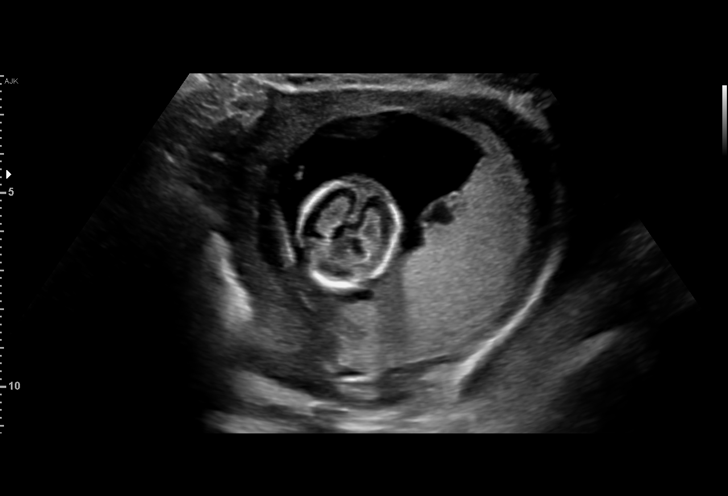
[im 26/42]
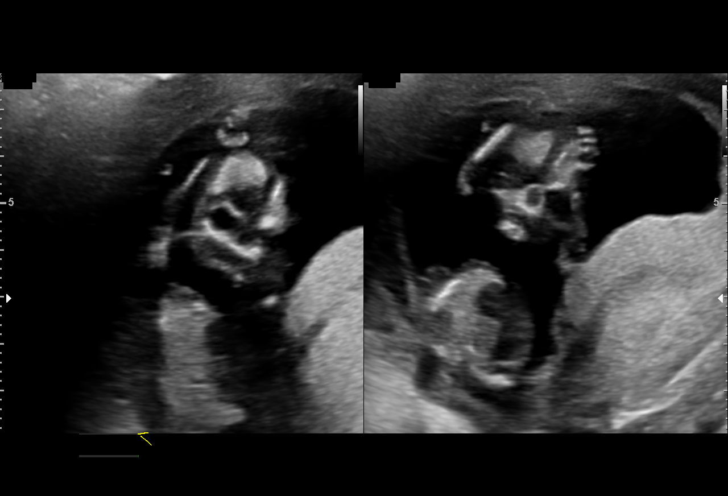
[im 29/42]
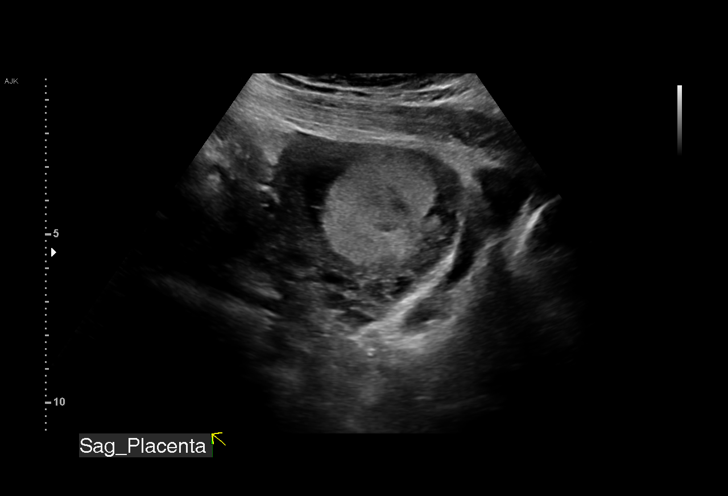
[im 32/42]
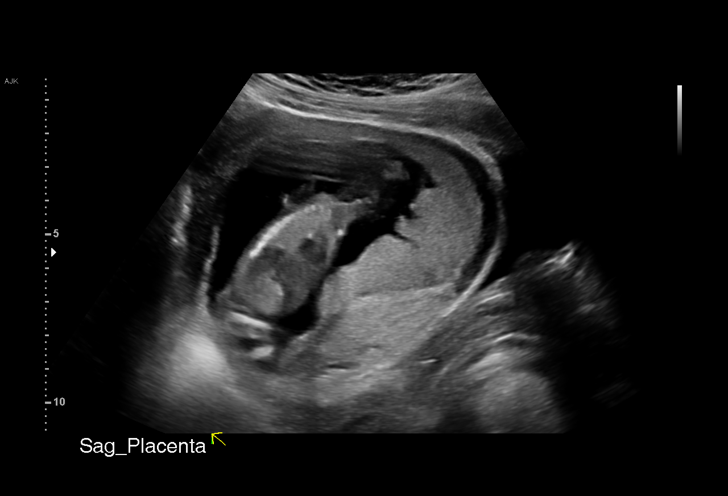
[im 35/42]
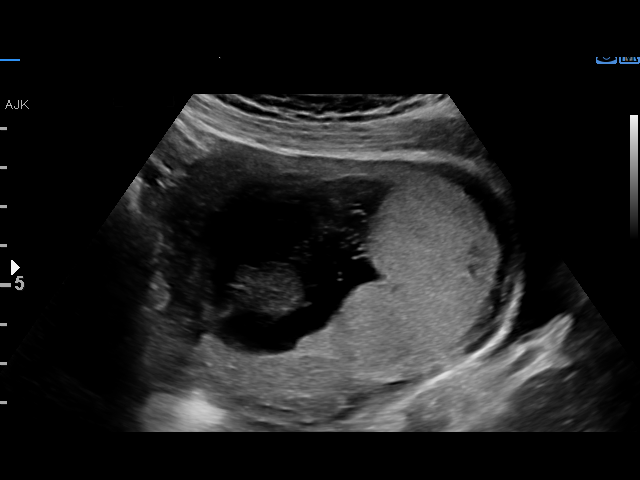
[im 38/42]
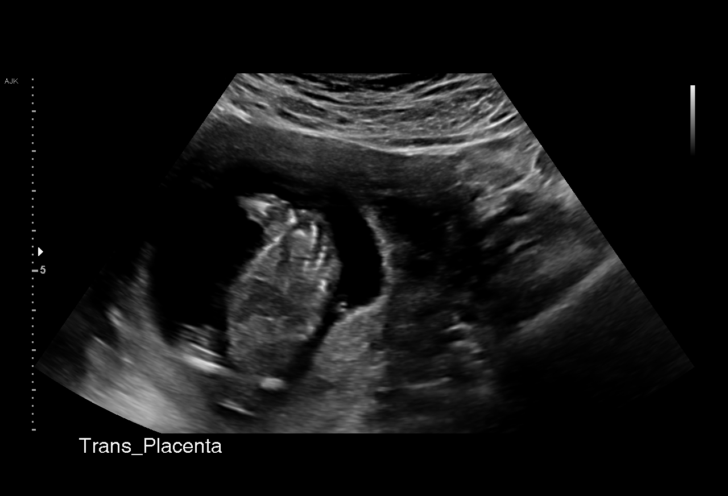
[im 42/42]
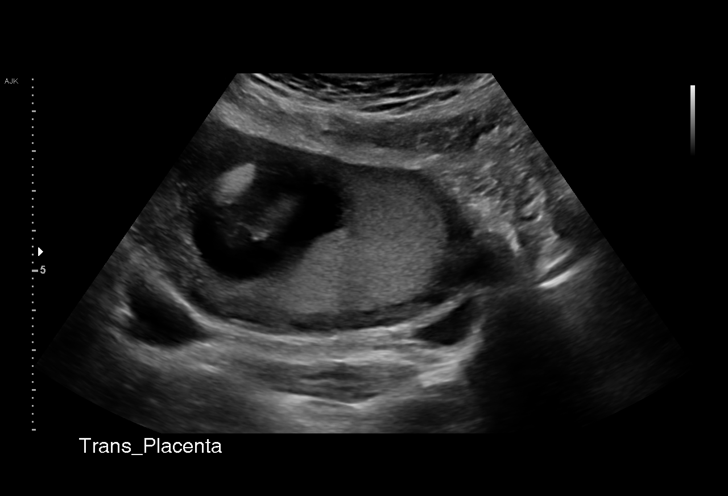

[15 of 28 positions shown; findings below may reference images not displayed]

1  US MFM OB LIMITED                    76815.01     CREDI YEKANI
 ----------------------------------------------------------------------

 ----------------------------------------------------------------------
Indications

  14 weeks gestation of pregnancy
  Vaginal bleeding in pregnancy, second
  trimester (low risk NIPS; [facility])
 ----------------------------------------------------------------------
Vital Signs

 BMI:
Fetal Evaluation

 Num Of Fetuses:         1
 Fetal Heart Rate(bpm):  154
 Cardiac Activity:       Observed
 Placenta:               Anterior
 P. Cord Insertion:      Visualized

 Amniotic Fluid
 AFI FV:      Within normal limits

 Comment:    Small subchorionic hemorrhage noted. (2.8 x 0.8 x 0.9cm)
OB History

 Gravidity:    1
Gestational Age

 LMP:           14w 5d        Date:  05/06/18                 EDD:   02/10/19
 Best:          14w 5d     Det. By:  LMP  (05/06/18)          EDD:   02/10/19
Cervix Uterus Adnexa

 Cervix
 Appears closed, without funnelling.

 Uterus
 No abnormality visualized.

 Left Ovary
 Within normal limits.

 Right Ovary
 Within normal limits.

 Adnexa
 No abnormality visualized.
Impression

 Patient has been having minimal vaginal bleeding. A limited
 ultrasound study was performed. Amniotic fluid is normal and
 good fetal activity is seen. A small subchorionic hematoma
 (measurements above) is seen at the proximal end (fundal) of
 the placenta.

 I explained the findings and vaginal bleeding increases the
 risk of miscarriage or preterm delivery. However, if bleeding
 stops, the hematoma should resolve with good pregnancy
 outcome.
Recommendations

 -Fetal anatomy scan at 19 to 20 weeks.
                 Siegler, Gissell

## 2019-12-19 ENCOUNTER — Telehealth: Payer: Managed Care, Other (non HMO) | Admitting: Family

## 2019-12-19 DIAGNOSIS — H01113 Allergic dermatitis of right eye, unspecified eyelid: Secondary | ICD-10-CM | POA: Diagnosis not present

## 2019-12-19 DIAGNOSIS — H01116 Allergic dermatitis of left eye, unspecified eyelid: Secondary | ICD-10-CM | POA: Diagnosis not present

## 2019-12-19 MED ORDER — PREDNISONE 10 MG (21) PO TBPK: ORAL_TABLET | ORAL | 0 refills | Status: DC

## 2019-12-19 MED FILL — predniSONE 10 MG TABS: 10 | 6 days supply | Qty: 21 | Fill #0

## 2019-12-19 NOTE — Progress Notes (Signed)
E Visit for Rash  We are sorry that you are not feeling well. Here is how we plan to help!  Based on what you shared with me it looks like you have contact dermatitis of your eyelids.  Contact dermatitis is a skin rash caused by something that touches the skin and causes irritation or inflammation.  Your skin may be red, swollen, dry, cracked, and itch.  The rash should go away in a few days but can last a few weeks.  If you get a rash, it's important to figure out what caused it so the irritant can be avoided in the future.  I recommend you take Benadryl 25 mg - 50 mg every 4 hours to control the symptoms but if they last over 24 hours it is best that you see an office based provider for follow up. Avoid rubbing your eyes, you can also use cool compresses. It should resolve in 24-48 hours.     HOME CARE:   Take cool showers and avoid direct sunlight.  Apply cool compress or wet dressings.  Take a bath in an oatmeal bath.  Sprinkle content of one Aveeno packet under running faucet with comfortably warm water.  Bathe for 15-20 minutes, 1-2 times daily.  Pat dry with a towel. Do not rub the rash.  Use hydrocortisone cream.  Take an antihistamine like Benadryl for widespread rashes that itch.  The adult dose of Benadryl is 25-50 mg by mouth 4 times daily.  Caution:  This type of medication may cause sleepiness.  Do not drink alcohol, drive, or operate dangerous machinery while taking antihistamines.  Do not take these medications if you have prostate enlargement.  Read package instructions thoroughly on all medications that you take.  GET HELP RIGHT AWAY IF:   Symptoms don't go away after treatment.  Severe itching that persists.  If you rash spreads or swells.  If you rash begins to smell.  If it blisters and opens or develops a yellow-brown crust.  You develop a fever.  You have a sore throat.  You become short of breath.  MAKE SURE YOU:  Understand these  instructions. Will watch your condition. Will get help right away if you are not doing well or get worse.  Thank you for choosing an e-visit. Your e-visit answers were reviewed by a board certified advanced clinical practitioner to complete your personal care plan. Depending upon the condition, your plan could have included both over the counter or prescription medications. Please review your pharmacy choice. Be sure that the pharmacy you have chosen is open so that you can pick up your prescription now.  If there is a problem you may message your provider in MyChart to have the prescription routed to another pharmacy. Your safety is important to [place]. If you have drug allergies check your prescription carefully.  For the next 24 hours, you can use MyChart to ask questions about today's visit, request a non-urgent call back, or ask for a work or school excuse from your e-visit provider. You will get an email in the next two days asking about your experience. I hope that your e-visit has been valuable and will speed your recovery.    Approximately 5 minutes was spent documenting and reviewing patient's chart.

## 2019-12-19 NOTE — Addendum Note (Signed)
Addended by: Jannifer Rodney A on: 12/19/2019 12:00 PM   Modules accepted: Orders

## 2019-12-26 ENCOUNTER — Ambulatory Visit (INDEPENDENT_AMBULATORY_CARE_PROVIDER_SITE_OTHER): Payer: Managed Care, Other (non HMO) | Admitting: Medical-Surgical

## 2019-12-26 ENCOUNTER — Encounter: Payer: Self-pay | Admitting: Medical-Surgical

## 2019-12-26 VITALS — BP 116/80 | HR 71 | Temp 97.9°F | Ht 64.0 in | Wt 170.2 lb

## 2019-12-26 DIAGNOSIS — H01116 Allergic dermatitis of left eye, unspecified eyelid: Secondary | ICD-10-CM

## 2019-12-26 DIAGNOSIS — H01113 Allergic dermatitis of right eye, unspecified eyelid: Secondary | ICD-10-CM

## 2019-12-26 MED ORDER — PREDNISONE 10 MG (48) PO TBPK: ORAL_TABLET | Freq: Every day | ORAL | 0 refills | Status: AC

## 2019-12-26 MED FILL — predniSONE 10 MG TABS: 10 | 12 days supply | Qty: 48 | Fill #0

## 2019-12-26 NOTE — Progress Notes (Signed)
Subjective:    CC: eyelid swelling  HPI: Pleasant 29 year old female presenting today to establish care and discuss eyelid swelling. Was seen on 12/19/2019 for allergic dermatitis of bilateral eyelids. Treated with Benadryl, Pepcid, Claritin, and a 6 day prednisone taper pack. Prednisone was completed on Monday with full resolution of erythema and swelling of the eyelids. Patient reports awakening this morning with return of erythema and edema to the right upper and lower eyelids and some erythema of the left upper eyelid near the inner canthus. No drainage, vision changes, pain, itching, fever, chills. No new chemical, cosmetic, animal exposures. No new detergents or materials. The only thing she can identify as new is goggles that she is required to wear at work.   I reviewed the past medical history, family history, social history, surgical history, and allergies today and no changes were needed.  Please see the problem list section below in epic for further details.  Past Medical History: Past Medical History:  Diagnosis Date  . Anxiety   . Bartholin cyst   . Complication of anesthesia    hard to wake up  . Endometriosis, suspected    dx by symptoms, no surgery  . Febrile seizure (HCC)   . UTI (lower urinary tract infection), Pyelo 2013   Past Surgical History: Past Surgical History:  Procedure Laterality Date  . RETAINED PLACENTA REMOVAL    . SHOULDER SURGERY    . TONSILLECTOMY     Social History: Social History   Socioeconomic History  . Marital status: Married    Spouse name: Not on file  . Number of children: Not on file  . Years of education: Not on file  . Highest education level: Not on file  Occupational History  . Not on file  Tobacco Use  . Smoking status: Never Smoker  . Smokeless tobacco: Never Used  Substance and Sexual Activity  . Alcohol use: Yes    Comment: 1-2 drinks/month, wine  . Drug use: No  . Sexual activity: Yes    Birth control/protection:  I.U.D.  Other Topics Concern  . Not on file  Social History Narrative  . Not on file   Social Determinants of Health   Financial Resource Strain: Low Risk   . Difficulty of Paying Living Expenses: Not hard at all  Food Insecurity: No Food Insecurity  . Worried About Programme researcher, broadcasting/film/video in the Last Year: Never true  . Ran Out of Food in the Last Year: Never true  Transportation Needs: No Transportation Needs  . Lack of Transportation (Medical): No  . Lack of Transportation (Non-Medical): No  Physical Activity:   . Days of Exercise per Week:   . Minutes of Exercise per Session:   Stress: Stress Concern Present  . Feeling of Stress : Rather much  Social Connections:   . Frequency of Communication with Friends and Family:   . Frequency of Social Gatherings with Friends and Family:   . Attends Religious Services:   . Active Member of Clubs or Organizations:   . Attends Banker Meetings:   Marland Kitchen Marital Status:    Family History: Family History  Problem Relation Age of Onset  . Cancer Mother        cervical  . Depression Mother   . Epilepsy Father   . Hyperlipidemia Father   . Skin cancer Sister   . Diabetes Maternal Grandmother   . Heart disease Maternal Grandmother   . Cancer Maternal Grandmother   . Diabetes  Maternal Grandfather   . Heart disease Maternal Grandfather   . Diabetes Paternal Grandmother   . Heart disease Paternal Grandmother   . Diabetes Paternal Grandfather   . Heart disease Paternal Grandfather   . Hyperlipidemia Paternal Grandfather   . Heart attack Paternal Grandfather    Allergies: Allergies  Allergen Reactions  . Wasp Venom Anaphylaxis  . Tape Rash   Medications: See med rec.  Review of Systems: See HPI for pertinent positives and negatives.   Objective:    General: Well Developed, well nourished, and in no acute distress.  Neuro: Alert and oriented x3.  HEENT: Normocephalic, atraumatic. EOM intact. Sclera white, non-injected.  Erythema and edema to upper and lower right eyelid. Small area of erythema to left upper lid near inner canthus. No drainage or excessive lacrimation.  Skin: Warm and dry. Cardiac: Regular rate and rhythm, no murmurs rubs or gallops, no lower extremity edema.  Respiratory: Clear to auscultation bilaterally. Not using accessory muscles, speaking in full sentences.   Impression and Recommendations:    1. Allergic dermatitis of eyelids of both eyes, unspecified eyelid Low suspicion for cellulitis given no pain and full resolution with prednisone. Suspect short prednisone dose pack may not have allowed full resolution. Will try a 12 day prednisone taper pack to see if this fully resolves the symptoms. Advised patient to be watchful for possible causes of the allergic dermatitis. If no improvement with longer taper pack or if pain develops, may need to initiate treatment for cellulitis. - predniSONE (STERAPRED UNI-PAK 48 TAB) 10 MG (48) TBPK tablet; Take by mouth daily. 12-Day taper, po  Dispense: 48 tablet; Refill: 0  Return if symptoms worsen or fail to improve. ___________________________________________ Clearnce Sorrel, DNP, APRN, FNP-BC [facility] [place]

## 2020-02-05 IMAGING — US US MFM OB FOLLOW UP
1 series · 14 of 28 positions shown · non-contrast
Comparison: none

[Series 1: us mfm ob follow up · 36 acquisitions, 14 frames shown]
[im 2/36]
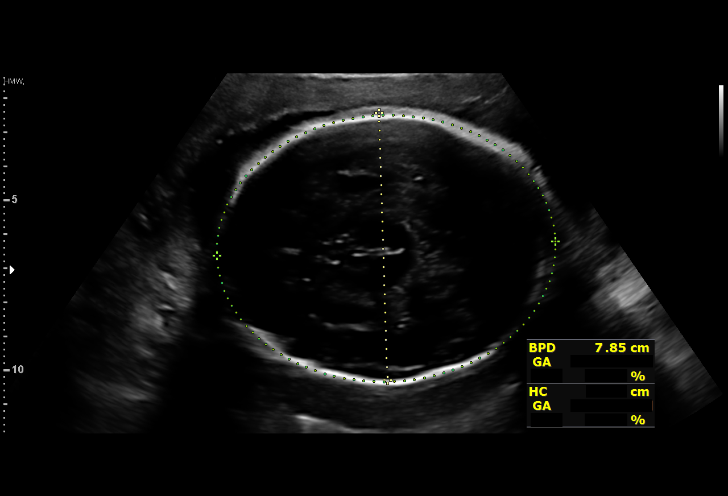
[im 4/36]
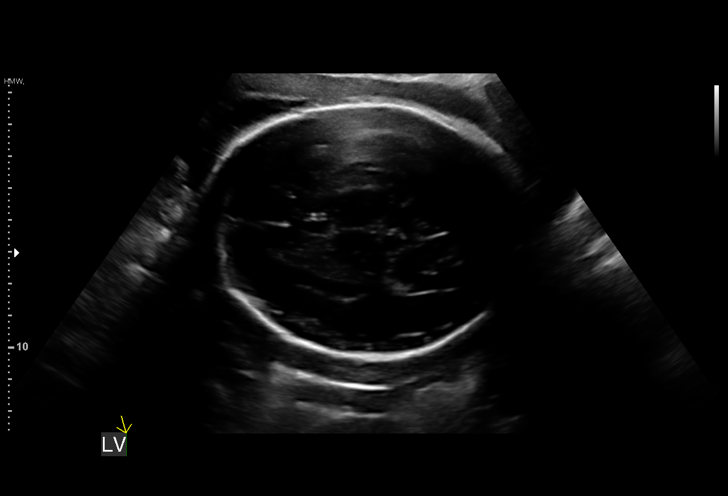
[im 7/36]
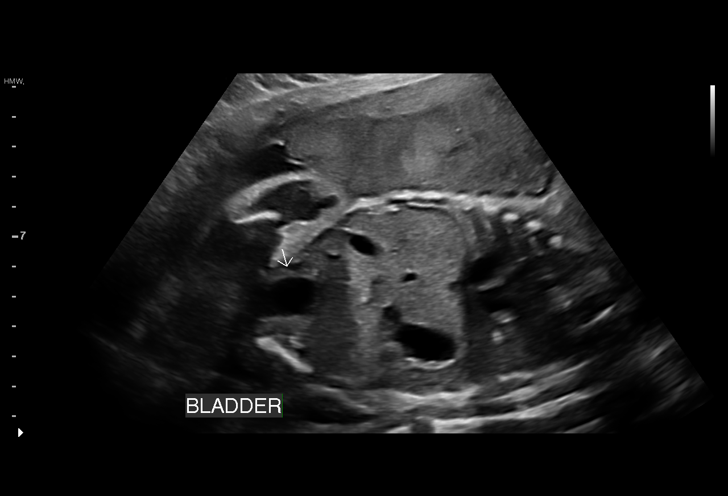
[im 10/36]
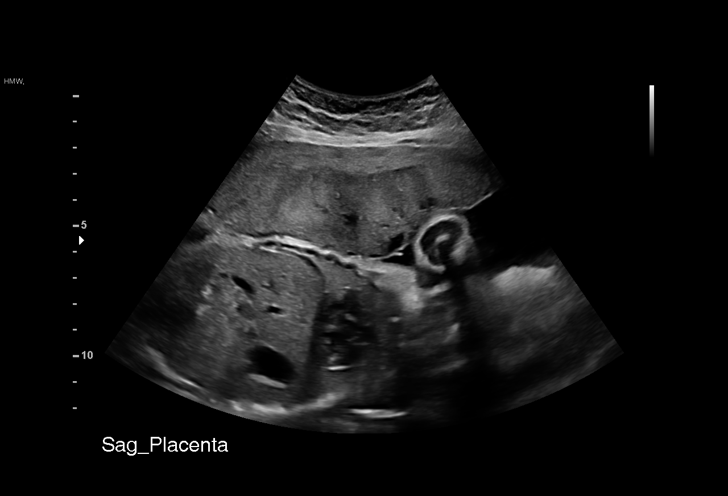
[im 12/36]
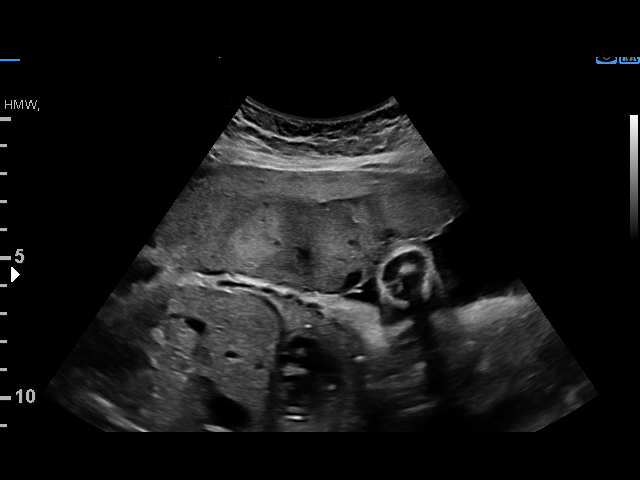
[im 15/36]
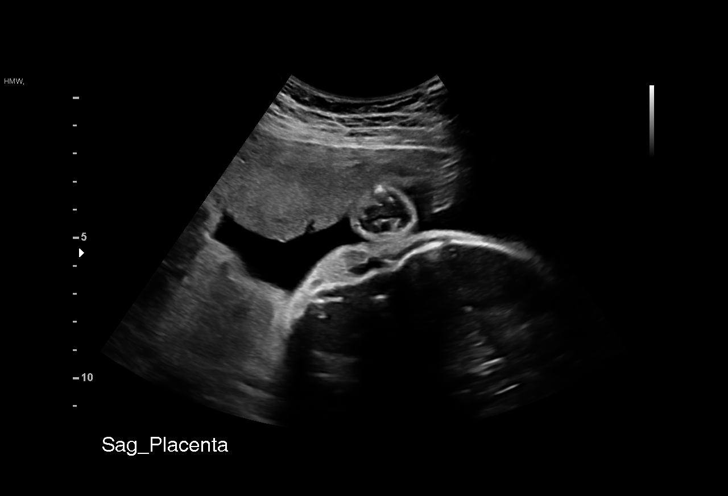
[im 17/36]
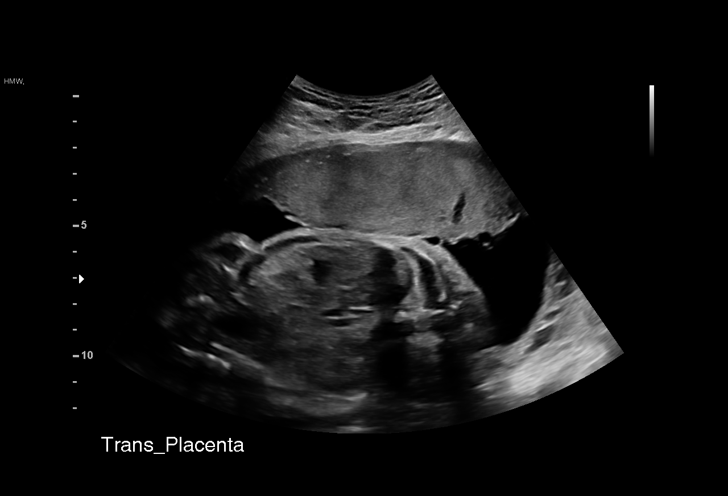
[im 20/36]
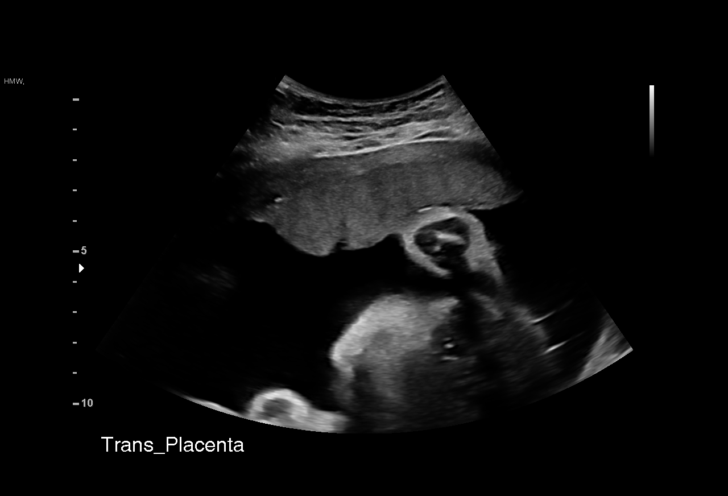
[im 23/36]
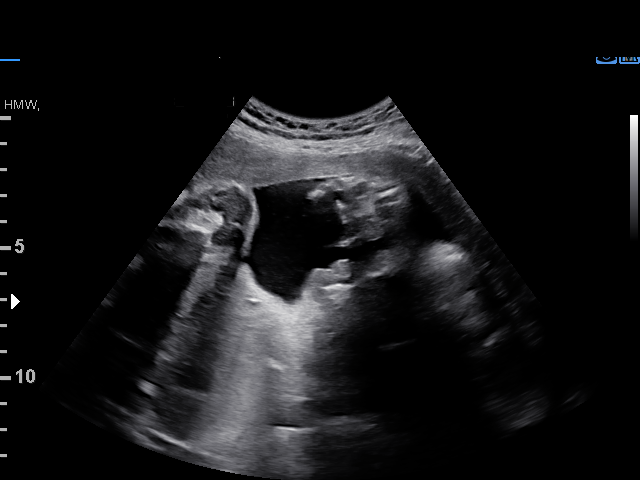
[im 25/36]
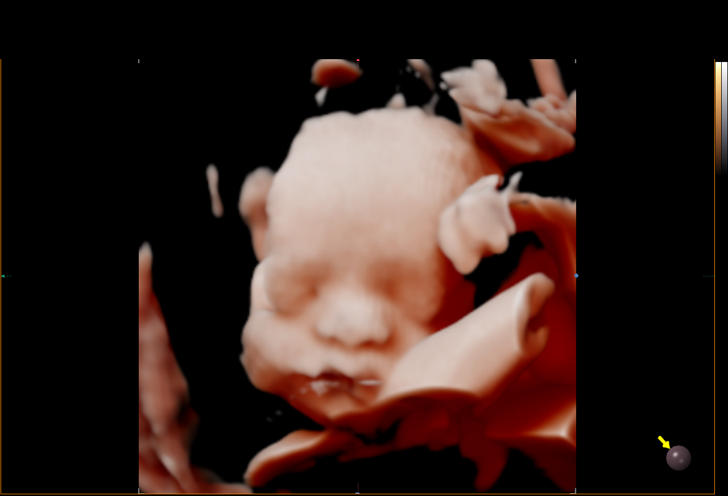
[im 28/36]
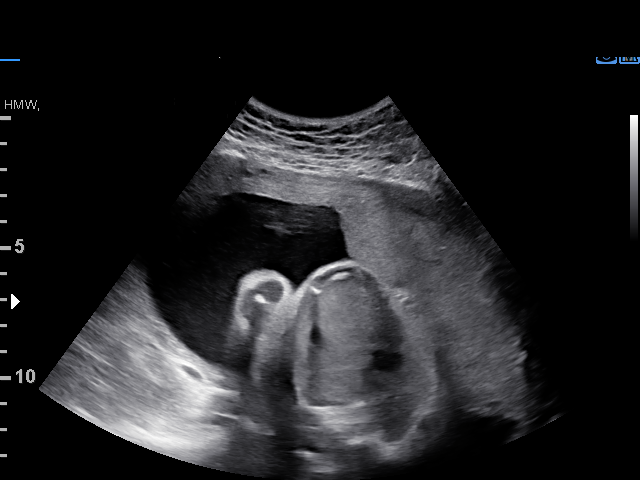
[im 30/36]
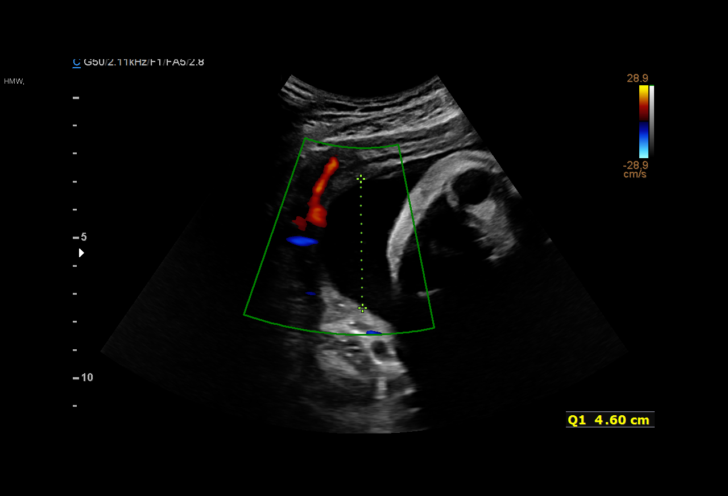
[im 33/36]
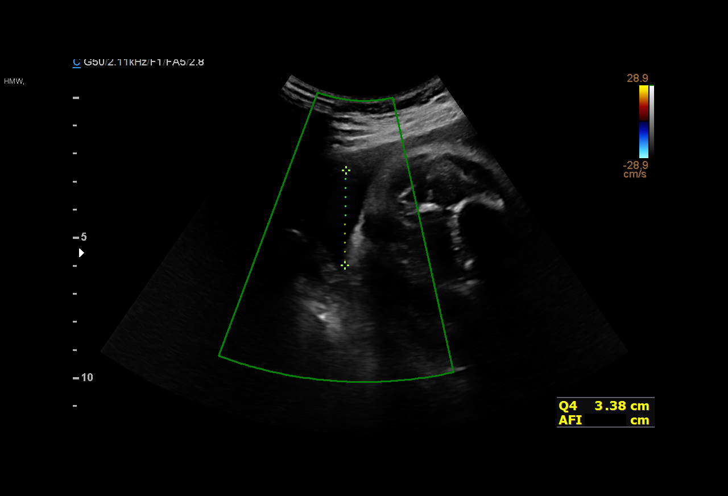
[im 36/36]
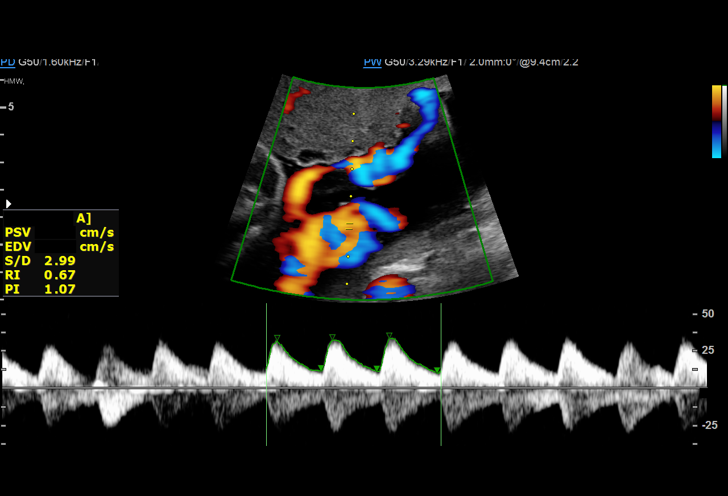

[14 of 28 positions shown; findings below may reference images not displayed]

LOUL NP                                 [HOSPITAL]

 ----------------------------------------------------------------------

 ----------------------------------------------------------------------
Indications

  30 weeks gestation of pregnancy
  Encounter for other antenatal screening
  follow-up
 ----------------------------------------------------------------------
Vital Signs

                                                Height:        5'3"
Fetal Evaluation

 Num Of Fetuses:         1
 Fetal Heart Rate(bpm):  144
 Cardiac Activity:       Observed
 Presentation:           Cephalic
 Placenta:               Anterior
 P. Cord Insertion:      Visualized, central

 Amniotic Fluid
 AFI FV:      Within normal limits

 AFI Sum(cm)     %Tile       Largest Pocket(cm)
 17.91           67

 RUQ(cm)       RLQ(cm)       LUQ(cm)        LLQ(cm)

Biometry

 BPD:      77.8  mm     G. Age:  31w 2d         68  %    CI:        76.08   %    70 - 86
                                                         FL/HC:      19.0   %    19.2 -
 HC:      282.7  mm     G. Age:  31w 0d         35  %    HC/AC:      1.21        0.99 -
 AC:      233.1  mm     G. Age:  27w 4d        < 3  %    FL/BPD:     69.0   %    71 - 87
 FL:       53.7  mm     G. Age:  28w 3d          4  %    FL/AC:      23.0   %    20 - 24
 HUM:        46  mm     G. Age:  27w 1d        < 5  %

 Est. FW:    0848  gm    2 lb 11 oz      20  %
OB History

 Gravidity:    1
Gestational Age

 LMP:           30w 2d        Date:  05/06/18                 EDD:   02/10/19
 U/S Today:     29w 4d                                        EDD:   02/15/19
 Best:          30w 2d     Det. By:  LMP  (05/06/18)          EDD:   02/10/19
Anatomy



 Other:  Open hands prev visualized. Technically difficult due to fetal position.
         Male gender.
Cervix Uterus Adnexa

 Cervix
 Not visualized (advanced GA >48wks)

 Uterus
 No abnormality visualized.

 Left Ovary
 No adnexal mass visualized.

 Right Ovary
 No adnexal mass visualized.

 Cul De Sac
 No free fluid seen.

 Adnexa
 No abnormality visualized.
Impression

 On ultrasound, fetal growth is appropriate for gestational age.
 Abdominal circumference measurement is at less than the
 10th percentile (not consistent with growth restriction).
 Amniotic fluid is normal and good fetal activity is seen.
 Incidentally noted antenatal testing is reassuring. Umbilical
 artery Doppler study is normal.

 I reassured the patient of the findings that are NOT consistent
 with growth restriction. However, I recommend a follow-up
 fetal growth assessment in 3 weeks to assess interval growth.
Recommendations

 An appointment was made for her to return in 3 weeks for
 fetal growth assessment.
                 [place], [place]

## 2020-03-12 MED FILL — MONTELUKAST SOD 10 MG TAB: 10 | 14 days supply | Qty: 14 | Fill #0

## 2020-03-12 MED FILL — SM ALL DAY ALLERGY-D 5-120: 5-120 | 12 days supply | Qty: 24 | Fill #0

## 2020-03-12 MED FILL — HYDROCODONE-HOMATROPINE SYR: 5-1.5 | 6 days supply | Qty: 120 | Fill #0

## 2020-03-28 IMAGING — US US MFM FETAL BPP W/NONSTRESS
1 series · 13 of 28 positions shown · non-contrast
Comparison: none

[Series 1: us mfm fetal bpp w/nonstress · 13 of 60 slices shown]
[im 3/60]
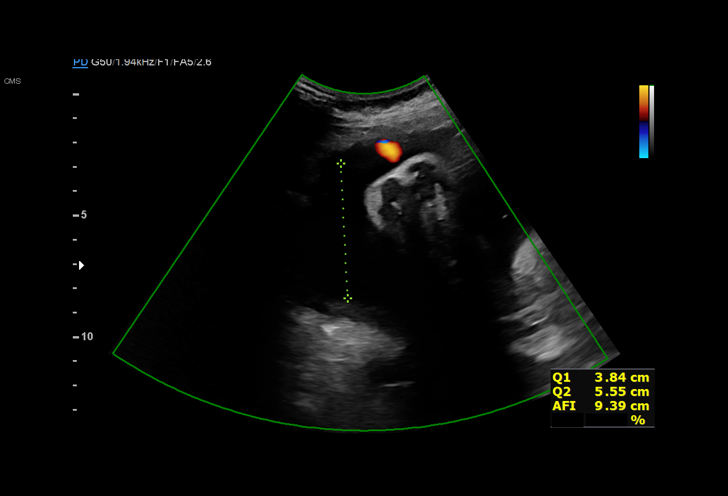
[im 7/60]
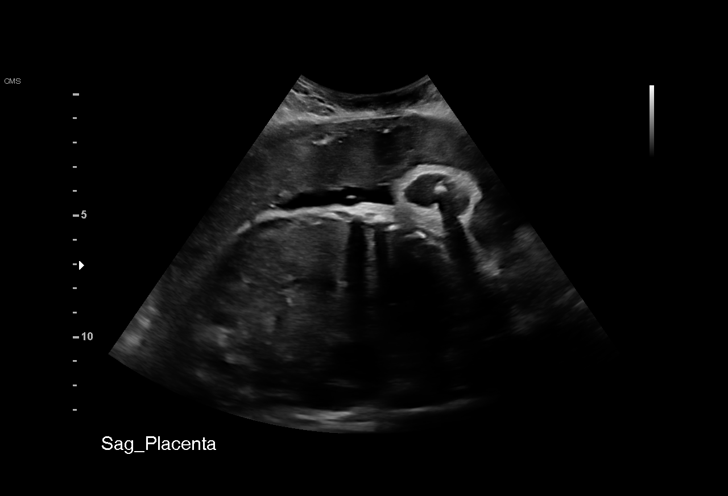
[im 11/60]
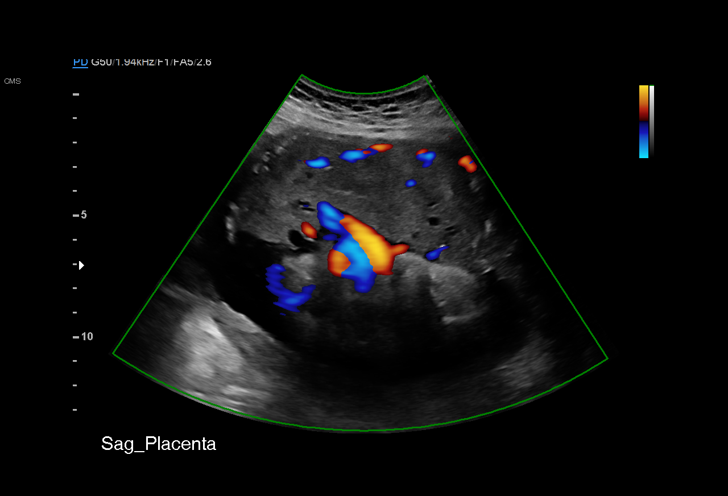
[im 16/60]
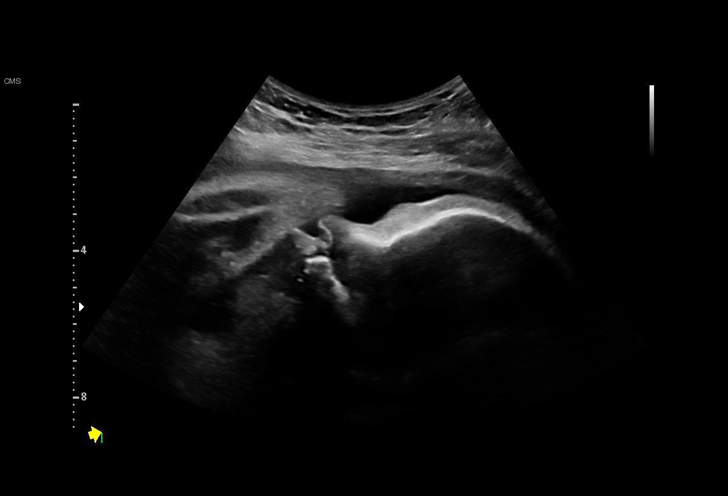
[im 20/60]
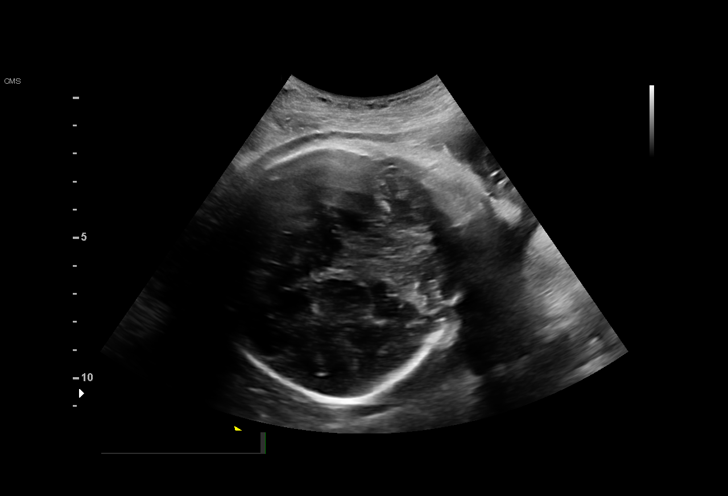
[im 25/60]
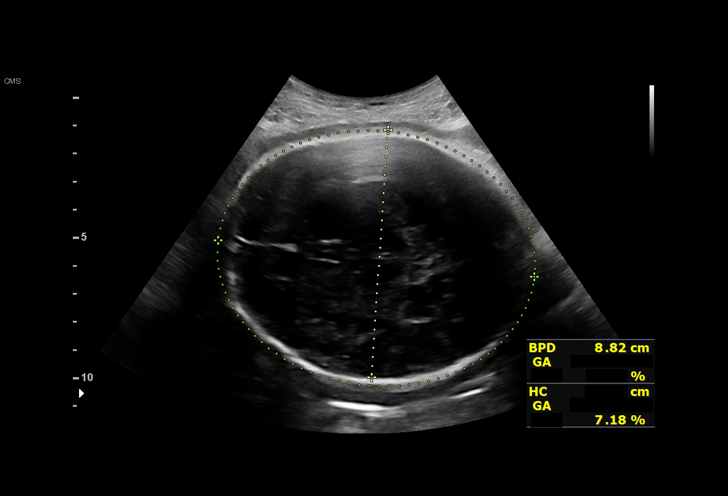
[im 31/60]
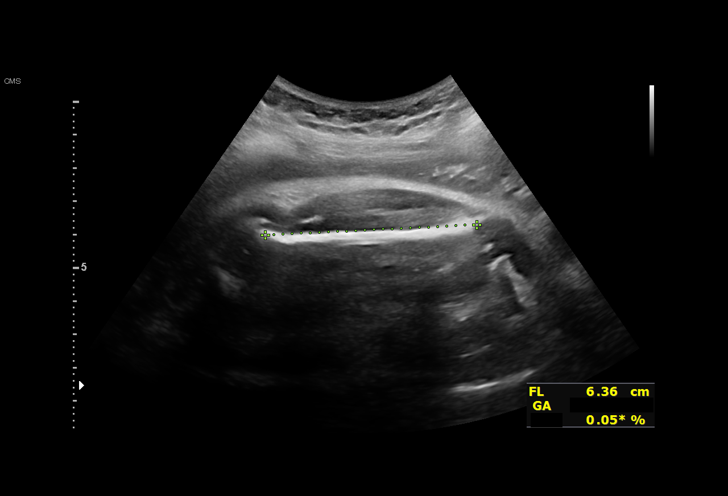
[im 35/60]
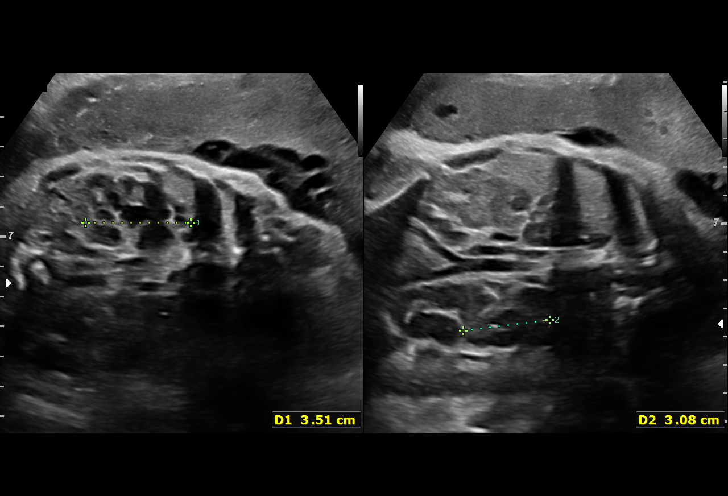
[im 40/60]
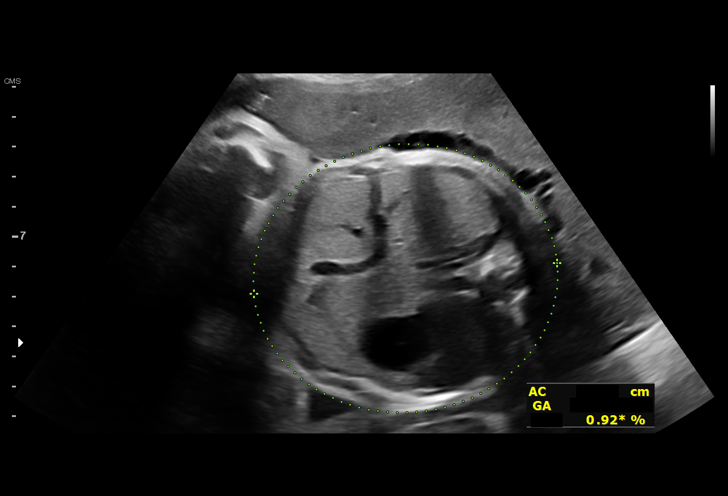
[im 44/60]
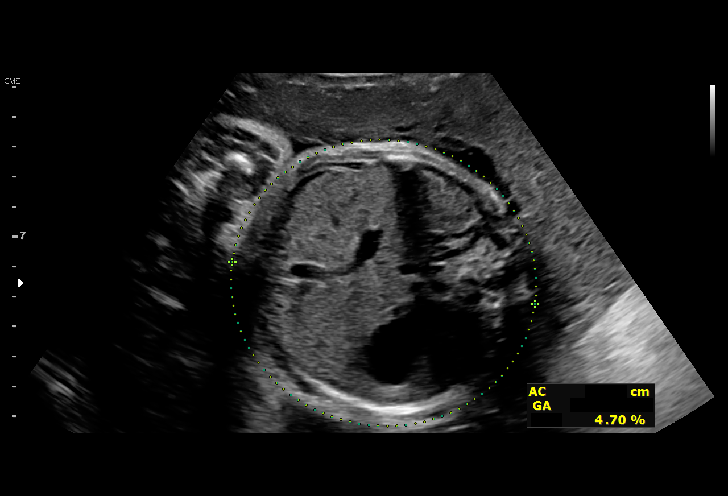
[im 49/60]
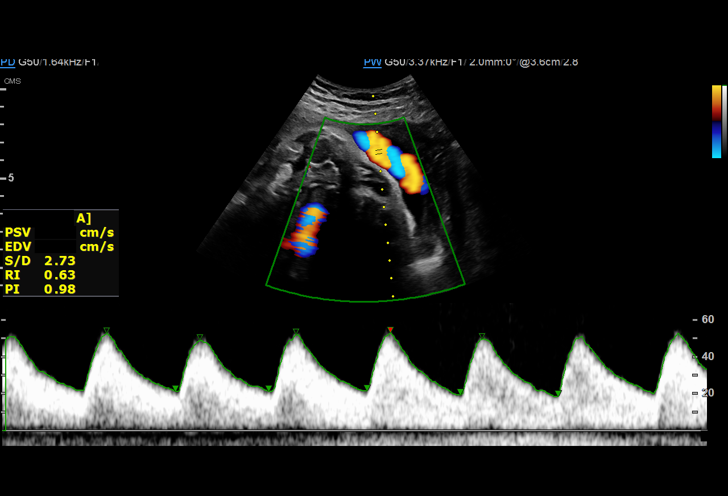
[im 53/60]
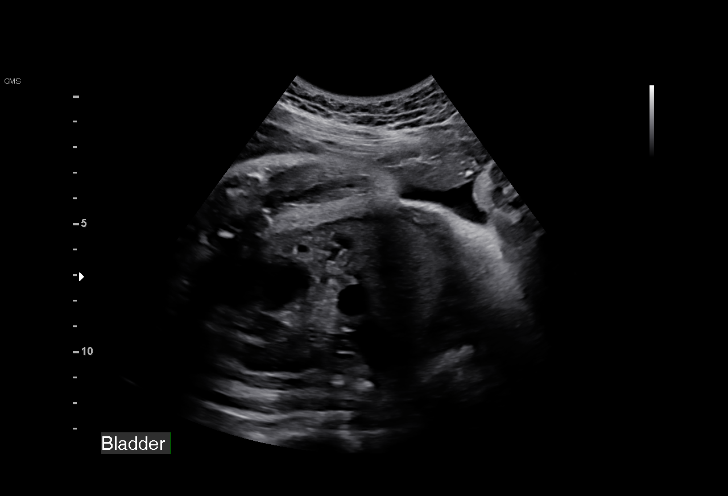
[im 57/60]
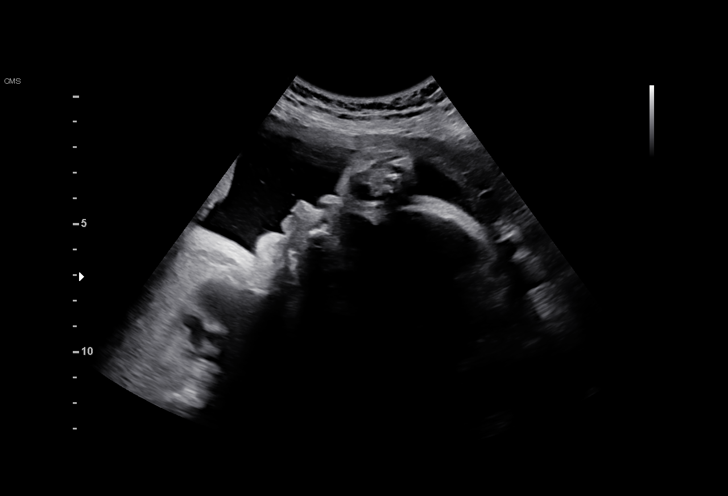

[13 of 28 positions shown; findings below may reference images not displayed]

[facility]                                 [HOSP[facility]]

     W/NONSTRESS
  3  US MFM UA CORD DOPPLER               76820.02     ALIA TIGER
 ----------------------------------------------------------------------

 ----------------------------------------------------------------------
Indications

  Encounter for other antenatal screening
  follow-up
  Maternal care for known or suspected poor
  fetal growth, third trimester, not applicable or
  unspecified
  37 weeks gestation of pregnancy
 ----------------------------------------------------------------------
Vital Signs

                                                Height:        5'3"
Fetal Evaluation

 Num Of Fetuses:         1
 Fetal Heart Rate(bpm):  138
 Cardiac Activity:       Observed
 Presentation:           Cephalic
 Placenta:               Anterior
 P. Cord Insertion:      Visualized

 Amniotic Fluid
 AFI FV:      Within normal limits

 AFI Sum(cm)     %Tile       Largest Pocket(cm)
 16              61
 RUQ(cm)       RLQ(cm)       LUQ(cm)        LLQ(cm)

Biophysical Evaluation

 Amniotic F.V:   Within normal limits       F. Tone:        Observed
 F. Movement:    Observed                   N.S.T:          Reactive
 F. Breathing:   Observed                   Score:          [DATE]
Biometry

 BPD:      88.2  mm     G. Age:  35w 5d         17  %    CI:        75.37   %    70 - 86
                                                         FL/HC:      19.7   %    20.9 -
 HC:      322.2  mm     G. Age:  36w 3d          7  %    HC/AC:      1.06        0.92 -
 AC:      303.9  mm     G. Age:  34w 2d          2  %    FL/BPD:     72.1   %    71 - 87
 FL:       63.6  mm     G. Age:  32w 6d        < 1  %    FL/AC:      20.9   %    20 - 24
 HUM:      56.5  mm     G. Age:  32w 6d        < 5  %

 Est. FW:    2960  gm      5 lb 4 oz      3  %
OB History

 Gravidity:    1
Gestational Age

 LMP:           37w 5d        Date:  05/06/18                 EDD:   02/10/19
 U/S Today:     34w 6d                                        EDD:   03/02/19
 Best:          37w 5d     Det. By:  LMP  (05/06/18)          EDD:   02/10/19
Anatomy

 Cranium:               Appears normal         Aortic Arch:            Previously seen
 Cavum:                 Appears normal         Ductal Arch:            Previously seen
 Ventricles:            Appears normal         Diaphragm:              Appears normal
 Choroid Plexus:        Appears normal         Stomach:                Appears normal, left
                                                                       sided
 Cerebellum:            Appears normal         Abdomen:                Previously seen
 Posterior Fossa:       Previously seen        Abdominal Wall:         Previously seen
 Nuchal Fold:           Not applicable (>20    Cord Vessels:           Previously seen
                        wks GA)
 Face:                  Orbits and profile     Kidneys:                Appear normal
                        previously seen
 Lips:                  Appears normal         Bladder:                Appears normal
 Thoracic:              Appears normal         Spine:                  Previously seen
 Heart:                 Previously seen        Upper Extremities:      Previously seen
 RVOT:                  Appears normal         Lower Extremities:      Previously seen
 LVOT:                  Appears normal

 Other:  Open hands prev visualized. Technically difficult due to fetal position.
Doppler - Fetal Vessels

 Umbilical Artery
  S/D     %tile     RI              PI                     ADFV    RDFV
 2.62       71   0.62             0.96                        No      No

Cervix Uterus Adnexa
 Cervix
 Not visualized (advanced GA >47wks)

 Uterus
 No abnormality visualized.

 Left Ovary
 Within normal limits.

 Right Ovary
 Within normal limits.

 Cul De Sac
 No free fluid seen.

 Adnexa
 No abnormality visualized.
Impression

 Patient returned for fetal growth assessment. On ultrasound
 performed at 30-weeks' gestation, the abdominal
 circumference measurement was at less than the 10th
 percentile. Follow-up scan 3 weeks later showed normal
 abdominal circumference and growth.
 complications.
 Amniotic fluid is normal and good fetal activity is seen. The
 estimated fetal weight is at the 3rd percentile. Antenatal
 testing is reassuring. Umbilical artery Doppler study showed
 normal forward diastolic flow. NST is reactive. BPP [DATE].
 Fetal weight is estimated by using Hadlock formula
 (consistent with [facility] recommendation).
 I explained the finding of fetal growth restriction (EFW 3rd
 percentile). I recommended delivery at 38 weeks. Growth-
 restricted fetuses have a higher-likelihood of stillbirth.
 Patient does not want to undergo induction of labor at least till
 39 weeks.
Recommendations

 -Follow-up BPP, NST and Doppler next week.
 -Delivery at 39 weeks.
                 Ornano, Arcinio

## 2020-04-23 ENCOUNTER — Encounter: Payer: Self-pay | Admitting: Medical-Surgical

## 2020-04-24 ENCOUNTER — Encounter: Payer: Self-pay | Admitting: Medical-Surgical

## 2021-01-19 ENCOUNTER — Telehealth: Payer: Self-pay | Admitting: General Practice

## 2021-01-19 NOTE — Telephone Encounter (Signed)
Transition Care Management Follow-up Telephone Call Date of discharge and from where: [facility] 01/18/21 How have you been since you were released from the hospital? Doing better. Any questions or concerns? No  Items Reviewed: Did the pt receive and understand the discharge instructions provided? Yes  Medications obtained and verified? Yes  Other? No  Any new allergies since your discharge? No  Dietary orders reviewed? Yes Do you have support at home? Yes   Home Care and Equipment/Supplies: Were home health services ordered? no   Functional Questionnaire: (I = Independent and D = Dependent) ADLs: I  Bathing/Dressing- I  Meal Prep- I  Eating- I  Maintaining continence- I  Transferring/Ambulation- I  Managing Meds- I  Follow up appointments reviewed:  PCP Hospital f/u appt confirmed? No   Specialist Hospital f/u appt confirmed? No   Are transportation arrangements needed? No  If their condition worsens, is the pt aware to call PCP or go to the Emergency Dept.? Yes Was the patient provided with contact information for the PCP's office or ED? Yes Was to pt encouraged to call back with questions or concerns? Yes

## 2022-05-17 ENCOUNTER — Other Ambulatory Visit (HOSPITAL_BASED_OUTPATIENT_CLINIC_OR_DEPARTMENT_OTHER): Payer: Self-pay

## 2022-05-17 MED ORDER — FLUOXETINE HCL 20 MG PO CAPS
20.0000 mg | ORAL_CAPSULE | Freq: Every day | ORAL | 0 refills | Status: AC
  Filled 2022-05-17: qty 30, 30d supply, fill #0

## 2022-05-21 ENCOUNTER — Other Ambulatory Visit (HOSPITAL_BASED_OUTPATIENT_CLINIC_OR_DEPARTMENT_OTHER): Payer: Self-pay
